# Patient Record
Sex: Female | Born: 1970
Health system: Southern US, Community
[De-identification: ages and names within clinical notes are randomized; demographics above are authoritative.]

## PROBLEM LIST (undated history)

## (undated) DIAGNOSIS — D649 Anemia, unspecified: Secondary | ICD-10-CM

## (undated) DIAGNOSIS — K219 Gastro-esophageal reflux disease without esophagitis: Secondary | ICD-10-CM

## (undated) DIAGNOSIS — N6310 Unspecified lump in the right breast, unspecified quadrant: Secondary | ICD-10-CM

## (undated) DIAGNOSIS — R51 Headache: Secondary | ICD-10-CM

## (undated) DIAGNOSIS — R519 Headache, unspecified: Secondary | ICD-10-CM

## (undated) DIAGNOSIS — E785 Hyperlipidemia, unspecified: Secondary | ICD-10-CM

## (undated) DIAGNOSIS — T7840XA Allergy, unspecified, initial encounter: Secondary | ICD-10-CM

## (undated) DIAGNOSIS — J45909 Unspecified asthma, uncomplicated: Secondary | ICD-10-CM

## (undated) HISTORY — DX: Hyperlipidemia, unspecified: E78.5

## (undated) HISTORY — PX: ROTATOR CUFF REPAIR: SHX139

## (undated) HISTORY — DX: Gastro-esophageal reflux disease without esophagitis: K21.9

## (undated) HISTORY — DX: Allergy, unspecified, initial encounter: T78.40XA

## (undated) HISTORY — PX: BREAST EXCISIONAL BIOPSY: SUR124

## (undated) HISTORY — PX: CHOLECYSTECTOMY: SHX55

## (undated) HISTORY — PX: COLONOSCOPY: SHX174

## (undated) HISTORY — PX: BREAST BIOPSY: SHX20

## (undated) HISTORY — PX: TONSILLECTOMY: SUR1361

## (undated) HISTORY — PX: SINOSCOPY: SHX187

## (undated) HISTORY — DX: Unspecified asthma, uncomplicated: J45.909

## (undated) HISTORY — DX: Anemia, unspecified: D64.9

## (undated) HISTORY — PX: UPPER GASTROINTESTINAL ENDOSCOPY: SHX188

## (undated) HISTORY — PX: ABDOMINAL HYSTERECTOMY: SHX81

---

## 2003-10-05 ENCOUNTER — Ambulatory Visit (HOSPITAL_COMMUNITY): Admission: RE | Admit: 2003-10-05 | Discharge: 2003-10-05 | Payer: Self-pay | Admitting: Gastroenterology

## 2004-12-11 HISTORY — PX: ESOPHAGOGASTRODUODENOSCOPY: SHX1529

## 2005-03-13 ENCOUNTER — Ambulatory Visit (HOSPITAL_BASED_OUTPATIENT_CLINIC_OR_DEPARTMENT_OTHER): Admission: RE | Admit: 2005-03-13 | Discharge: 2005-03-13 | Payer: Self-pay | Admitting: Urology

## 2005-03-13 ENCOUNTER — Ambulatory Visit (HOSPITAL_COMMUNITY): Admission: RE | Admit: 2005-03-13 | Discharge: 2005-03-13 | Payer: Self-pay | Admitting: Urology

## 2016-10-08 ENCOUNTER — Other Ambulatory Visit: Payer: Self-pay | Admitting: Obstetrics and Gynecology

## 2016-10-08 DIAGNOSIS — N6489 Other specified disorders of breast: Secondary | ICD-10-CM

## 2016-10-13 ENCOUNTER — Ambulatory Visit
Admission: RE | Admit: 2016-10-13 | Discharge: 2016-10-13 | Disposition: A | Discharge status: Home / Self Care | Payer: 59 | Source: Ambulatory Visit | Attending: Obstetrics and Gynecology | Admitting: Obstetrics and Gynecology

## 2016-10-13 DIAGNOSIS — N6489 Other specified disorders of breast: Secondary | ICD-10-CM

## 2016-11-05 ENCOUNTER — Other Ambulatory Visit: Payer: Self-pay | Admitting: General Surgery

## 2016-11-05 DIAGNOSIS — N6489 Other specified disorders of breast: Secondary | ICD-10-CM

## 2016-11-06 ENCOUNTER — Other Ambulatory Visit: Payer: Self-pay | Admitting: General Surgery

## 2016-11-06 DIAGNOSIS — N6489 Other specified disorders of breast: Secondary | ICD-10-CM

## 2016-11-25 ENCOUNTER — Encounter (HOSPITAL_BASED_OUTPATIENT_CLINIC_OR_DEPARTMENT_OTHER): Payer: Self-pay | Admitting: *Deleted

## 2016-11-26 ENCOUNTER — Encounter (HOSPITAL_BASED_OUTPATIENT_CLINIC_OR_DEPARTMENT_OTHER): Payer: Self-pay | Admitting: *Deleted

## 2016-11-28 ENCOUNTER — Ambulatory Visit
Admission: RE | Admit: 2016-11-28 | Discharge: 2016-11-28 | Disposition: A | Discharge status: Home / Self Care | Payer: 59 | Source: Ambulatory Visit | Attending: General Surgery | Admitting: General Surgery

## 2016-11-28 DIAGNOSIS — N6489 Other specified disorders of breast: Secondary | ICD-10-CM

## 2016-11-28 NOTE — Progress Notes (Signed)
Boost drink given with instructions to complete by [place], pt verbalized understanding.

## 2016-12-01 ENCOUNTER — Encounter (HOSPITAL_BASED_OUTPATIENT_CLINIC_OR_DEPARTMENT_OTHER)
Admission: RE | Disposition: A | Discharge status: Home / Self Care | Payer: Self-pay | Source: Ambulatory Visit | Attending: General Surgery

## 2016-12-01 ENCOUNTER — Ambulatory Visit
Admission: RE | Admit: 2016-12-01 | Discharge: 2016-12-01 | Disposition: A | Discharge status: Home / Self Care | Payer: 59 | Source: Ambulatory Visit | Attending: General Surgery | Admitting: General Surgery

## 2016-12-01 ENCOUNTER — Ambulatory Visit (HOSPITAL_BASED_OUTPATIENT_CLINIC_OR_DEPARTMENT_OTHER)
Admission: RE | Admit: 2016-12-01 | Discharge: 2016-12-01 | Disposition: A | Discharge status: Home / Self Care | Payer: 59 | Source: Ambulatory Visit | Attending: General Surgery | Admitting: General Surgery

## 2016-12-01 ENCOUNTER — Encounter (HOSPITAL_BASED_OUTPATIENT_CLINIC_OR_DEPARTMENT_OTHER): Payer: Self-pay | Admitting: Certified Registered"

## 2016-12-01 ENCOUNTER — Ambulatory Visit (HOSPITAL_BASED_OUTPATIENT_CLINIC_OR_DEPARTMENT_OTHER): Payer: 59 | Admitting: Certified Registered"

## 2016-12-01 DIAGNOSIS — Z90710 Acquired absence of both cervix and uterus: Secondary | ICD-10-CM | POA: Diagnosis not present

## 2016-12-01 DIAGNOSIS — N6489 Other specified disorders of breast: Secondary | ICD-10-CM | POA: Diagnosis present

## 2016-12-01 DIAGNOSIS — Z886 Allergy status to analgesic agent status: Secondary | ICD-10-CM | POA: Diagnosis not present

## 2016-12-01 DIAGNOSIS — L905 Scar conditions and fibrosis of skin: Secondary | ICD-10-CM | POA: Diagnosis not present

## 2016-12-01 DIAGNOSIS — Z803 Family history of malignant neoplasm of breast: Secondary | ICD-10-CM | POA: Diagnosis not present

## 2016-12-01 HISTORY — DX: Unspecified lump in the right breast, unspecified quadrant: N63.10

## 2016-12-01 HISTORY — DX: Headache: R51

## 2016-12-01 HISTORY — DX: Headache, unspecified: R51.9

## 2016-12-01 HISTORY — PX: RADIOACTIVE SEED GUIDED EXCISIONAL BREAST BIOPSY: SHX6490

## 2016-12-01 SURGERY — RADIOACTIVE SEED GUIDED BREAST BIOPSY
Anesthesia: General | Site: Breast | Laterality: Right

## 2016-12-01 MED ORDER — GABAPENTIN 300 MG PO CAPS
300.0000 mg | ORAL_CAPSULE | ORAL | Status: AC
  Administered 2016-12-01: 300 mg via ORAL

## 2016-12-01 MED ORDER — MIDAZOLAM HCL 2 MG/2ML IJ SOLN
INTRAMUSCULAR | Status: AC
  Filled 2016-12-01: qty 2

## 2016-12-01 MED ORDER — MIDAZOLAM HCL 2 MG/2ML IJ SOLN
1.0000 mg | INTRAMUSCULAR | Status: DC | PRN
  Administered 2016-12-01: 2 mg via INTRAVENOUS

## 2016-12-01 MED ORDER — LIDOCAINE HCL (CARDIAC) 20 MG/ML IV SOLN
INTRAVENOUS | Status: DC | PRN
  Administered 2016-12-01: 60 mg via INTRAVENOUS

## 2016-12-01 MED ORDER — ACETAMINOPHEN 500 MG PO TABS
ORAL_TABLET | ORAL | Status: AC
  Filled 2016-12-01: qty 2

## 2016-12-01 MED ORDER — FENTANYL CITRATE (PF) 100 MCG/2ML IJ SOLN
INTRAMUSCULAR | Status: AC
  Filled 2016-12-01: qty 2

## 2016-12-01 MED ORDER — EPHEDRINE SULFATE 50 MG/ML IJ SOLN
INTRAMUSCULAR | Status: DC | PRN
  Administered 2016-12-01 (×3): 10 mg via INTRAVENOUS

## 2016-12-01 MED ORDER — ACETAMINOPHEN 500 MG PO TABS
1000.0000 mg | ORAL_TABLET | ORAL | Status: AC
  Administered 2016-12-01: 1000 mg via ORAL

## 2016-12-01 MED ORDER — ONDANSETRON HCL 4 MG/2ML IJ SOLN
INTRAMUSCULAR | Status: AC
  Filled 2016-12-01: qty 10

## 2016-12-01 MED ORDER — GABAPENTIN 300 MG PO CAPS
ORAL_CAPSULE | ORAL | Status: AC
  Filled 2016-12-01: qty 1

## 2016-12-01 MED ORDER — CELECOXIB 200 MG PO CAPS: 200.0000 mg | ORAL_CAPSULE | ORAL | Status: DC

## 2016-12-01 MED ORDER — LIDOCAINE 2% (20 MG/ML) 5 ML SYRINGE
INTRAMUSCULAR | Status: AC
  Filled 2016-12-01: qty 10

## 2016-12-01 MED ORDER — DEXAMETHASONE SODIUM PHOSPHATE 10 MG/ML IJ SOLN
INTRAMUSCULAR | Status: AC
  Filled 2016-12-01: qty 2

## 2016-12-01 MED ORDER — SUCCINYLCHOLINE CHLORIDE 200 MG/10ML IV SOSY
PREFILLED_SYRINGE | INTRAVENOUS | Status: AC
  Filled 2016-12-01: qty 10

## 2016-12-01 MED ORDER — METOCLOPRAMIDE HCL 5 MG/ML IJ SOLN: 10.0000 mg | Freq: Once | INTRAMUSCULAR | Status: DC | PRN

## 2016-12-01 MED ORDER — CEFAZOLIN SODIUM-DEXTROSE 2-4 GM/100ML-% IV SOLN
INTRAVENOUS | Status: AC
  Filled 2016-12-01: qty 100

## 2016-12-01 MED ORDER — MEPERIDINE HCL 25 MG/ML IJ SOLN: 6.2500 mg | INTRAMUSCULAR | Status: DC | PRN

## 2016-12-01 MED ORDER — LACTATED RINGERS IV SOLN: INTRAVENOUS | Status: DC

## 2016-12-01 MED ORDER — BUPIVACAINE HCL (PF) 0.25 % IJ SOLN
INTRAMUSCULAR | Status: DC | PRN
  Administered 2016-12-01: 10 mL

## 2016-12-01 MED ORDER — OXYCODONE-ACETAMINOPHEN 10-325 MG PO TABS: 1.0000 | ORAL_TABLET | Freq: Four times a day (QID) | ORAL | 0 refills | Status: AC | PRN

## 2016-12-01 MED ORDER — CEFAZOLIN SODIUM-DEXTROSE 2-4 GM/100ML-% IV SOLN
2.0000 g | INTRAVENOUS | Status: AC
  Administered 2016-12-01: 2 g via INTRAVENOUS

## 2016-12-01 MED ORDER — ONDANSETRON HCL 4 MG/2ML IJ SOLN
INTRAMUSCULAR | Status: DC | PRN
  Administered 2016-12-01: 4 mg via INTRAVENOUS

## 2016-12-01 MED ORDER — SCOPOLAMINE 1 MG/3DAYS TD PT72: 1.0000 | MEDICATED_PATCH | Freq: Once | TRANSDERMAL | Status: DC | PRN

## 2016-12-01 MED ORDER — LACTATED RINGERS IV SOLN
INTRAVENOUS | Status: DC
Start: 2016-12-01 — End: 2016-12-01
  Administered 2016-12-01: 11:00:00 via INTRAVENOUS

## 2016-12-01 MED ORDER — DEXAMETHASONE SODIUM PHOSPHATE 4 MG/ML IJ SOLN
INTRAMUSCULAR | Status: DC | PRN
  Administered 2016-12-01: 10 mg via INTRAVENOUS

## 2016-12-01 MED ORDER — PROPOFOL 10 MG/ML IV BOLUS
INTRAVENOUS | Status: DC | PRN
  Administered 2016-12-01: 120 mg via INTRAVENOUS

## 2016-12-01 MED ORDER — FENTANYL CITRATE (PF) 100 MCG/2ML IJ SOLN
50.0000 ug | INTRAMUSCULAR | Status: DC | PRN
  Administered 2016-12-01: 25 ug via INTRAVENOUS
  Administered 2016-12-01: 50 ug via INTRAVENOUS

## 2016-12-01 MED ORDER — PROPOFOL 500 MG/50ML IV EMUL
INTRAVENOUS | Status: AC
  Filled 2016-12-01: qty 50

## 2016-12-01 MED ORDER — FENTANYL CITRATE (PF) 100 MCG/2ML IJ SOLN: 25.0000 ug | INTRAMUSCULAR | Status: DC | PRN

## 2016-12-01 SURGICAL SUPPLY — 62 items
ADH SKN CLS APL DERMABOND .7 (GAUZE/BANDAGES/DRESSINGS) ×1
APPLIER CLIP 9.375 MED OPEN (MISCELLANEOUS)
APR CLP MED 9.3 20 MLT OPN (MISCELLANEOUS)
BINDER BREAST LRG (GAUZE/BANDAGES/DRESSINGS) ×2 IMPLANT
BINDER BREAST MEDIUM (GAUZE/BANDAGES/DRESSINGS) IMPLANT
BINDER BREAST XLRG (GAUZE/BANDAGES/DRESSINGS) IMPLANT
BINDER BREAST XXLRG (GAUZE/BANDAGES/DRESSINGS) IMPLANT
BLADE SURG 15 STRL LF DISP TIS (BLADE) ×1 IMPLANT
BLADE SURG 15 STRL SS (BLADE) ×3
CANISTER SUC SOCK COL 7IN (MISCELLANEOUS) IMPLANT
CANISTER SUCT 1200ML W/VALVE (MISCELLANEOUS) IMPLANT
CHLORAPREP W/TINT 26ML (MISCELLANEOUS) ×3 IMPLANT
CLIP APPLIE 9.375 MED OPEN (MISCELLANEOUS) IMPLANT
CLIP VESOCCLUDE SM WIDE 6/CT (CLIP) ×3 IMPLANT
CLOSURE WOUND 1/2 X4 (GAUZE/BANDAGES/DRESSINGS) ×1
COVER BACK TABLE 60X90IN (DRAPES) ×3 IMPLANT
COVER MAYO STAND STRL (DRAPES) ×3 IMPLANT
COVER PROBE W GEL 5X96 (DRAPES) ×3 IMPLANT
DECANTER SPIKE VIAL GLASS SM (MISCELLANEOUS) IMPLANT
DERMABOND ADVANCED (GAUZE/BANDAGES/DRESSINGS) ×2
DERMABOND ADVANCED .7 DNX12 (GAUZE/BANDAGES/DRESSINGS) ×1 IMPLANT
DEVICE DUBIN W/COMP PLATE 8390 (MISCELLANEOUS) ×3 IMPLANT
DRAPE LAPAROSCOPIC ABDOMINAL (DRAPES) ×3 IMPLANT
DRAPE UTILITY XL STRL (DRAPES) ×3 IMPLANT
DRSG TEGADERM 4X4.75 (GAUZE/BANDAGES/DRESSINGS) IMPLANT
ELECT COATED BLADE 2.86 ST (ELECTRODE) ×3 IMPLANT
ELECT REM PT RETURN 9FT ADLT (ELECTROSURGICAL) ×3
ELECTRODE REM PT RTRN 9FT ADLT (ELECTROSURGICAL) ×1 IMPLANT
GAUZE SPONGE 4X4 12PLY STRL LF (GAUZE/BANDAGES/DRESSINGS) IMPLANT
GLOVE BIO SURGEON STRL SZ7 (GLOVE) ×6 IMPLANT
GLOVE BIOGEL PI IND STRL 7.0 (GLOVE) IMPLANT
GLOVE BIOGEL PI IND STRL 7.5 (GLOVE) ×2 IMPLANT
GLOVE BIOGEL PI INDICATOR 7.0 (GLOVE) ×2
GLOVE BIOGEL PI INDICATOR 7.5 (GLOVE) ×4
GLOVE SURG SS PI 6.5 STRL IVOR (GLOVE) ×3 IMPLANT
GOWN STRL REUS W/ TWL LRG LVL3 (GOWN DISPOSABLE) ×2 IMPLANT
GOWN STRL REUS W/TWL LRG LVL3 (GOWN DISPOSABLE) ×6
HEMOSTAT ARISTA ABSORB 3G PWDR (MISCELLANEOUS) IMPLANT
ILLUMINATOR WAVEGUIDE N/F (MISCELLANEOUS) ×2 IMPLANT
KIT MARKER MARGIN INK (KITS) ×3 IMPLANT
LIGHT WAVEGUIDE WIDE FLAT (MISCELLANEOUS) IMPLANT
NDL HYPO 25X1 1.5 SAFETY (NEEDLE) ×1 IMPLANT
NEEDLE HYPO 25X1 1.5 SAFETY (NEEDLE) ×3 IMPLANT
NS IRRIG 1000ML POUR BTL (IV SOLUTION) IMPLANT
PACK BASIN DAY SURGERY FS (CUSTOM PROCEDURE TRAY) ×3 IMPLANT
PENCIL BUTTON HOLSTER BLD 10FT (ELECTRODE) ×3 IMPLANT
SLEEVE SCD COMPRESS KNEE MED (MISCELLANEOUS) ×3 IMPLANT
SPONGE LAP 4X18 X RAY DECT (DISPOSABLE) ×3 IMPLANT
STRIP CLOSURE SKIN 1/2X4 (GAUZE/BANDAGES/DRESSINGS) ×2 IMPLANT
SUT MNCRL AB 4-0 PS2 18 (SUTURE) IMPLANT
SUT MON AB 5-0 PS2 18 (SUTURE) ×2 IMPLANT
SUT SILK 2 0 SH (SUTURE) IMPLANT
SUT VIC AB 2-0 SH 27 (SUTURE) ×3
SUT VIC AB 2-0 SH 27XBRD (SUTURE) ×1 IMPLANT
SUT VIC AB 3-0 SH 27 (SUTURE) ×3
SUT VIC AB 3-0 SH 27X BRD (SUTURE) ×1 IMPLANT
SYR CONTROL 10ML LL (SYRINGE) ×3 IMPLANT
TOWEL OR 17X24 6PK STRL BLUE (TOWEL DISPOSABLE) ×3 IMPLANT
TOWEL OR NON WOVEN STRL DISP B (DISPOSABLE) ×3 IMPLANT
TUBE CONNECTING 20'X1/4 (TUBING)
TUBE CONNECTING 20X1/4 (TUBING) IMPLANT
YANKAUER SUCT BULB TIP NO VENT (SUCTIONS) IMPLANT

## 2016-12-01 NOTE — Anesthesia Postprocedure Evaluation (Addendum)
Anesthesia Post Note  Patient: Brooke Haas  Procedure(s) Performed: Procedure(s) (LRB): RIGHT RADIOACTIVE SEED GUIDED EXCISIONAL BREAST BIOPSY (Right)     Patient location during evaluation: PACU Anesthesia Type: General Level of consciousness: awake and alert Pain management: pain level controlled Vital Signs Assessment: post-procedure vital signs reviewed and stable Respiratory status: spontaneous breathing, nonlabored ventilation, respiratory function stable and patient connected to nasal cannula oxygen Cardiovascular status: blood pressure returned to baseline and stable Postop Assessment: no signs of nausea or vomiting Anesthetic complications: no    Last Vitals:  Vitals:   12/01/16 1215 12/01/16 1245  BP: 97/64 (!) 96/52  Pulse: 86 86  Resp: 11 18  Temp:  (!) 36.3 C    Last Pain:  Vitals:   12/01/16 0957  TempSrc: Oral                 Montez Hageman

## 2016-12-01 NOTE — Progress Notes (Signed)
Patient ID: Brooke Haas, female   DOB: 1970-09-18, 46 y.o.   MRN: [id number] NCCSR reviewed day of surgery

## 2016-12-01 NOTE — Discharge Instructions (Signed)
[facility] °Office Phone Number [phone] ° °POST OP INSTRUCTIONS ° °Always review your discharge instruction sheet given to you by the facility where your surgery was performed. ° °IF YOU HAVE DISABILITY OR FAMILY LEAVE FORMS, YOU MUST BRING THEM TO THE OFFICE FOR PROCESSING.  DO NOT GIVE THEM TO YOUR DOCTOR. ° °1. A prescription for pain medication may be given to you upon discharge.  Take your pain medication as prescribed, if needed.  If narcotic pain medicine is not needed, then you may take acetaminophen (Tylenol), naprosyn (Alleve) or ibuprofen (Advil) as needed. °2. Take your usually prescribed medications unless otherwise directed °3. If you need a refill on your pain medication, please contact your pharmacy.  They will contact our office to request authorization.  Prescriptions will not be filled after 5pm or on week-ends. °4. You should eat very light the first 24 hours after surgery, such as soup, crackers, pudding, etc.  Resume your normal diet the day after surgery. °5. Most patients will experience some swelling and bruising in the breast.  Ice packs and a good support bra will help.  Wear the breast binder provided or a sports bra for 72 hours day and night.  After that wear a sports bra during the day until you return to the office. Swelling and bruising can take several days to resolve.  °6. It is common to experience some constipation if taking pain medication after surgery.  Increasing fluid intake and taking a stool softener will usually help or prevent this problem from occurring.  A mild laxative (Milk of Magnesia or Miralax) should be taken according to package directions if there are no bowel movements after 48 hours. °7. Unless discharge instructions indicate otherwise, you may remove your bandages 48 hours after surgery and you may shower at that time.  You may have steri-strips (small skin tapes) in place directly over the incision.  These strips should be left on the  skin for 7-10 days and will come off on their own.  If your surgeon used skin glue on the incision, you may shower in 24 hours.  The glue will flake off over the next 2-3 weeks.  Any sutures or staples will be removed at the office during your follow-up visit. °8. ACTIVITIES:  You may resume regular daily activities (gradually increasing) beginning the next day.  Wearing a good support bra or sports bra minimizes pain and swelling.  You may have sexual intercourse when it is comfortable. °a. You may drive when you no longer are taking prescription pain medication, you can comfortably wear a seatbelt, and you can safely maneuver your car and apply brakes. °b. RETURN TO WORK:  ______________________________________________________________________________________ °9. You should see your doctor in the office for a follow-up appointment approximately two weeks after your surgery.  Your doctor’s nurse will typically make your follow-up appointment when she calls you with your pathology report.  Expect your pathology report 3-4 business days after your surgery.  You may call to check if you do not hear from us after three days. °10. OTHER INSTRUCTIONS: _______________________________________________________________________________________________ _____________________________________________________________________________________________________________________________________ °_____________________________________________________________________________________________________________________________________ °_____________________________________________________________________________________________________________________________________ ° °WHEN TO CALL DR WAKEFIELD: °1. Fever over 101.0 °2. Nausea and/or vomiting. °3. Extreme swelling or bruising. °4. Continued bleeding from incision. °5. Increased pain, redness, or drainage from the incision. ° °The clinic staff is available to answer your questions during regular  business hours.  Please don’t hesitate to call and ask to speak to one of the nurses for clinical concerns.  If   you have a medical emergency, go to the nearest emergency room or call 911.  A surgeon from [facility] is always on call at the hospital. ° °For further questions, please visit [email or web address] ° ° ° ° ° °Post Anesthesia Home Care Instructions ° °Activity: °Get plenty of rest for the remainder of the day. A responsible individual must stay with you for 24 hours following the procedure.  °For the next 24 hours, DO NOT: °-Drive a car °-Operate machinery °-Drink alcoholic beverages °-Take any medication unless instructed by your physician °-Make any legal decisions or sign important papers. ° °Meals: °Start with liquid foods such as gelatin or soup. Progress to regular foods as tolerated. Avoid greasy, spicy, heavy foods. If nausea and/or vomiting occur, drink only clear liquids until the nausea and/or vomiting subsides. Call your physician if vomiting continues. ° °Special Instructions/Symptoms: °Your throat may feel dry or sore from the anesthesia or the breathing tube placed in your throat during surgery. If this causes discomfort, gargle with warm salt water. The discomfort should disappear within 24 hours. ° °If you had a scopolamine patch placed behind your ear for the management of post- operative nausea and/or vomiting: ° °1. The medication in the patch is effective for 72 hours, after which it should be removed.  Wrap patch in a tissue and discard in the trash. Wash hands thoroughly with soap and water. °2. You may remove the patch earlier than 72 hours if you experience unpleasant side effects which may include dry mouth, dizziness or visual disturbances. °3. Avoid touching the patch. Wash your hands with soap and water after contact with the patch. °  ° °

## 2016-12-01 NOTE — Interval H&P Note (Signed)
History and Physical Interval Note:  12/01/2016 10:44 AM  Brooke Haas  has presented today for surgery, with the diagnosis of right breast mass  The various methods of treatment have been discussed with the patient and family. After consideration of risks, benefits and other options for treatment, the patient has consented to  Procedure(s): RIGHT RADIOACTIVE SEED GUIDED EXCISIONAL BREAST BIOPSY (Right) as a surgical intervention .  The patient's history has been reviewed, patient examined, no change in status, stable for surgery.  I have reviewed the patient's chart and labs.  Questions were answered to the patient's satisfaction.     Ajene Carchi

## 2016-12-01 NOTE — Anesthesia Procedure Notes (Signed)
Procedure Name: LMA Insertion Date/Time: 12/01/2016 10:54 AM Performed by: Guillermo Nehring D Pre-anesthesia Checklist: Patient identified, Emergency Drugs available, Suction available and Patient being monitored Patient Re-evaluated:Patient Re-evaluated prior to induction Oxygen Delivery Method: Circle system utilized Preoxygenation: Pre-oxygenation with 100% oxygen Induction Type: IV induction Ventilation: Mask ventilation without difficulty LMA: LMA inserted LMA Size: 3.0 Number of attempts: 1 Airway Equipment and Method: Bite block Placement Confirmation: positive ETCO2 Tube secured with: Tape Dental Injury: Teeth and Oropharynx as per pre-operative assessment

## 2016-12-01 NOTE — Anesthesia Preprocedure Evaluation (Signed)
Anesthesia Evaluation  Patient identified by MRN, date of birth, ID band Patient awake    Reviewed: Allergy & Precautions, NPO status , Patient's Chart, lab work & pertinent test results  Airway Mallampati: II  TM Distance: >3 FB Neck ROM: Full    Dental no notable dental hx.    Pulmonary neg pulmonary ROS,    Pulmonary exam normal breath sounds clear to auscultation       Cardiovascular negative cardio ROS Normal cardiovascular exam Rhythm:Regular Rate:Normal     Neuro/Psych negative neurological ROS  negative psych ROS   GI/Hepatic negative GI ROS, Neg liver ROS,   Endo/Other  negative endocrine ROS  Renal/GU negative Renal ROS  negative genitourinary   Musculoskeletal negative musculoskeletal ROS (+)   Abdominal   Peds negative pediatric ROS (+)  Hematology negative hematology ROS (+)   Anesthesia Other Findings   Reproductive/Obstetrics negative OB ROS                             Anesthesia Physical Anesthesia Plan  ASA: II  Anesthesia Plan: General   Post-op Pain Management:    Induction: Intravenous  PONV Risk Score and Plan: 3 and Ondansetron, Dexamethasone, Midazolam and Treatment may vary due to age or medical condition  Airway Management Planned: LMA  Additional Equipment:   Intra-op Plan:   Post-operative Plan:   Informed Consent: I have reviewed the patients History and Physical, chart, labs and discussed the procedure including the risks, benefits and alternatives for the proposed anesthesia with the patient or authorized representative who has indicated his/her understanding and acceptance.   Dental advisory given  Plan Discussed with: CRNA  Anesthesia Plan Comments:         Anesthesia Quick Evaluation

## 2016-12-01 NOTE — Op Note (Signed)
Preoperative diagnoses: Right breast distortion with core biopsy c/w radial scar Postoperative diagnosis: Same as above Procedure: Right breast seed excisional biopsy Surgeon: Dr. Serita Grammes Anesthesia: Gen. Estimated blood loss: minimal Complications: None Drains: None Specimens: Right breast tissue marked with paint Sponge and needle count correct at completion Disposition to recovery stable  Indications: This is a 63 yof who has family history breast cancer who presents after having a right breast distortion on mammogram.  The core biopsy was a radial scar.  We discussed options and have elected to excise this with seed guidance.   Procedure: After informed consent was obtained she was then taken to the operating room. She was given cefazolin. Sequential compression devices were on her legs. She was placed under general anesthesia without complication. Her chestwas then prepped and draped in the standard sterile surgical fashion. A surgical timeout was then performed. The seed was in the lateral right breast.  I infiltrated marcaine and made a  periareolar incision to hide the scar. I used the lighted retractor to dissect laterally to the seed. I then used the neoprobe to guide excision of the seed and surrounding tissue.  Mammogram confirmed removal of seed and the clip. This was then all sent to pathology. Hemostasis was observed. Clip was placed in the cavity. I closed the breast tissue with a 2-0 Vicryl. The dermis was closed with 3-0 Vicryl and the skin with 5-0 Monocryl.Dermabond and steristrips were placed on the incision. A breast binder was placed. She was transferred to recovery stable

## 2016-12-01 NOTE — Transfer of Care (Signed)
Immediate Anesthesia Transfer of Care Note  Patient: Brooke Haas  Procedure(s) Performed: Procedure(s): RIGHT RADIOACTIVE SEED GUIDED EXCISIONAL BREAST BIOPSY (Right)  Patient Location: PACU  Anesthesia Type:General  Level of Consciousness: awake, sedated and patient cooperative  Airway & Oxygen Therapy: Patient Spontanous Breathing and Patient connected to face mask oxygen  Post-op Assessment: Report given to RN and Post -op Vital signs reviewed and stable  Post vital signs: Reviewed and stable  Last Vitals:  Vitals:   12/01/16 0957  BP: (!) 98/59  Pulse: 68  Resp: 18  Temp: 36.7 C    Last Pain:  Vitals:   12/01/16 0957  TempSrc: Oral         Complications: No apparent anesthesia complications

## 2016-12-01 NOTE — H&P (Signed)
46 yof referred by Dr Jimmye Norman for a right breast distortion. she has fh in mom in her mid 49s of breast cancer who now has stage IV disease. she had no mass or dc. screening mm showed right breast distortion and core biopsy shows a csl. she is otherwise healthy and has no prior breast history  Past Surgical History Malachy Moan, RMA; 11/05/2016 1:40 PM) Cesarean Section - Multiple  Foot Surgery  Bilateral. Gallbladder Surgery - Laparoscopic  Hysterectomy (not due to cancer) - Complete  Shoulder Surgery  Right. Tonsillectomy   Diagnostic Studies History Malachy Moan, [place]; 11/05/2016 1:40 PM) Colonoscopy  never Mammogram  within last year Pap Smear  1-5 years ago  Allergies Malachy Moan, RMA; 11/05/2016 1:40 PM) Aleve *ANALGESICS - ANTI-INFLAMMATORY*  Ibuprofen *ANALGESICS - ANTI-INFLAMMATORY*  Naproxen *ANALGESICS - ANTI-INFLAMMATORY*   Medication History Malachy Moan, RMA; 11/05/2016 1:41 PM) Naratriptan HCl (2.5MG  Tablet, Oral) Active. Uribel (118MG  Capsule, Oral) Active. Multivitamins/Minerals (Oral) Active. Allegra (Oral) Specific strength unknown - Active. Vitamin D (1000UNIT Tablet, Oral) Active. Zantac (150MG  Tablet, Oral) Active. Medications Reconciled  Social History Malachy Moan, [place]; 11/05/2016 1:40 PM) Alcohol use  Occasional alcohol use. Caffeine use  Coffee. No drug use  Tobacco use  Never smoker.  Family History Malachy Moan, [place]; 11/05/2016 1:40 PM) Breast Cancer  Mother. Cerebrovascular Accident  Mother. Diabetes Mellitus  Father. Hypertension  Father. Migraine Headache  Daughter, Mother, Tora Duck, Pandora Leiter.  Pregnancy / Birth History Malachy Moan, [place]; 11/05/2016 1:40 PM) Age at menarche  17 years. Gravida  3 Length (months) of breastfeeding  12-24 Maternal age  60-30 Para  3  Other Problems Malachy Moan, [place]; 11/05/2016 1:40 PM) Gastroesophageal Reflux Disease  Migraine Headache     Review of Systems Malachy Moan RMA; 11/05/2016 1:40 PM) General Not Present- Appetite Loss, Chills, Fatigue, Fever, Night Sweats, Weight Gain and Weight Loss. Skin Not Present- Change in Wart/Mole, Dryness, Hives, Jaundice, New Lesions, Non-Healing Wounds, Rash and Ulcer. HEENT Not Present- Earache, Hearing Loss, Hoarseness, Nose Bleed, Oral Ulcers, Ringing in the Ears, Seasonal Allergies, Sinus Pain, Sore Throat, Visual Disturbances, Wears glasses/contact lenses and Yellow Eyes. Respiratory Not Present- Bloody sputum, Chronic Cough, Difficulty Breathing, Snoring and Wheezing. Gastrointestinal Not Present- Abdominal Pain, Bloating, Bloody Stool, Change in Bowel Habits, Chronic diarrhea, Constipation, Difficulty Swallowing, Excessive gas, Gets full quickly at meals, Hemorrhoids, Indigestion, Nausea, Rectal Pain and Vomiting. Female Genitourinary Not Present- Frequency, Nocturia, Painful Urination, Pelvic Pain and Urgency. Musculoskeletal Not Present- Back Pain, Joint Pain, Joint Stiffness, Muscle Pain, Muscle Weakness and Swelling of Extremities. Neurological Not Present- Decreased Memory, Fainting, Headaches, Numbness, Seizures, Tingling, Tremor, Trouble walking and Weakness. Psychiatric Not Present- Anxiety, Bipolar, Change in Sleep Pattern, Depression, Fearful and Frequent crying. Endocrine Not Present- Cold Intolerance, Excessive Hunger, Hair Changes, Heat Intolerance, Hot flashes and New Diabetes. Hematology Not Present- Blood Thinners, Easy Bruising, Excessive bleeding, Gland problems, HIV and Persistent Infections.  Vitals Malachy Moan RMA; 11/05/2016 1:42 PM) 11/05/2016 1:41 PM Weight: 145.4 lb Height: 65in Body Surface Area: 1.73 m Body Mass Index: 24.2 kg/m  Temp.: 97.43F  Pulse: 87 (Regular)  BP: 110/80 (Sitting, Left Arm, Standard) Physical Exam Rolm Bookbinder MD; 11/05/2016 2:31 PM) General Mental Status-Alert. Orientation-Oriented X3. Head and  Neck Thyroid -Note: no thyromegaly. Chest and Lung Exam Chest and lung exam reveals -quiet, even and easy respiratory effort with no use of accessory muscles and on auscultation, normal breath sounds, no adventitious sounds and normal vocal resonance. Breast Nipples-No Discharge. Breast Lump-No Palpable Breast Mass.  Cardiovascular Cardiovascular examination reveals -normal heart sounds, regular rate and rhythm with no murmurs. Lymphatic Head & Neck General Head & Neck Lymphatics: Bilateral - Description - Normal. Axillary General Axillary Region: Bilateral - Description - Normal. Note: no Celina adenopathy   Assessment & Plan Rolm Bookbinder MD; 11/05/2016 2:33 PM) RADIAL SCAR OF BREAST (N64.89) Story: right breast seed guided excisional biopsy we discussed option of observation but I do think with her fh reasonable to excise. we discussed up to10% upgrade rate. this could be early cancer (dcis) or atypia. atypia certainly would possibly prompt change in mgt so excision would be worthwhile. we discussed seed guided excision, recovery and risks.

## 2016-12-02 ENCOUNTER — Encounter (HOSPITAL_BASED_OUTPATIENT_CLINIC_OR_DEPARTMENT_OTHER): Payer: Self-pay | Admitting: General Surgery

## 2016-12-03 NOTE — Addendum Note (Signed)
Addendum  created 12/03/16 0804 by Montez Hageman, MD   Sign clinical note

## 2017-03-30 ENCOUNTER — Other Ambulatory Visit: Payer: Self-pay | Admitting: Obstetrics and Gynecology

## 2017-03-30 DIAGNOSIS — R9389 Abnormal findings on diagnostic imaging of other specified body structures: Secondary | ICD-10-CM

## 2017-04-08 ENCOUNTER — Ambulatory Visit
Admission: RE | Admit: 2017-04-08 | Discharge: 2017-04-08 | Disposition: A | Discharge status: Home / Self Care | Payer: 59 | Source: Ambulatory Visit | Attending: Obstetrics and Gynecology | Admitting: Obstetrics and Gynecology

## 2017-04-08 DIAGNOSIS — R9389 Abnormal findings on diagnostic imaging of other specified body structures: Secondary | ICD-10-CM

## 2017-04-08 MED ORDER — GADOBENATE DIMEGLUMINE 529 MG/ML IV SOLN
13.0000 mL | Freq: Once | INTRAVENOUS | Status: AC | PRN
  Administered 2017-04-08: 13 mL via INTRAVENOUS

## 2017-10-16 ENCOUNTER — Other Ambulatory Visit: Payer: Self-pay | Admitting: Obstetrics and Gynecology

## 2017-10-16 DIAGNOSIS — Z1231 Encounter for screening mammogram for malignant neoplasm of breast: Secondary | ICD-10-CM

## 2017-10-16 DIAGNOSIS — N6489 Other specified disorders of breast: Secondary | ICD-10-CM

## 2017-10-27 ENCOUNTER — Ambulatory Visit
Admission: RE | Admit: 2017-10-27 | Discharge: 2017-10-27 | Disposition: A | Discharge status: Home / Self Care | Payer: 59 | Source: Ambulatory Visit | Attending: Obstetrics and Gynecology | Admitting: Obstetrics and Gynecology

## 2017-10-27 DIAGNOSIS — Z1231 Encounter for screening mammogram for malignant neoplasm of breast: Secondary | ICD-10-CM

## 2017-10-30 ENCOUNTER — Other Ambulatory Visit: Payer: 59

## 2017-11-04 ENCOUNTER — Ambulatory Visit
Admission: RE | Admit: 2017-11-04 | Discharge: 2017-11-04 | Disposition: A | Discharge status: Home / Self Care | Payer: 59 | Source: Ambulatory Visit | Attending: Obstetrics and Gynecology | Admitting: Obstetrics and Gynecology

## 2017-11-04 DIAGNOSIS — N6489 Other specified disorders of breast: Secondary | ICD-10-CM

## 2017-11-04 MED ORDER — GADOBENATE DIMEGLUMINE 529 MG/ML IV SOLN
13.0000 mL | Freq: Once | INTRAVENOUS | Status: AC | PRN
  Administered 2017-11-04: 13 mL via INTRAVENOUS

## 2018-06-07 ENCOUNTER — Other Ambulatory Visit: Payer: Self-pay | Admitting: Obstetrics and Gynecology

## 2018-06-07 DIAGNOSIS — N6489 Other specified disorders of breast: Secondary | ICD-10-CM

## 2018-06-18 ENCOUNTER — Ambulatory Visit
Admission: RE | Admit: 2018-06-18 | Discharge: 2018-06-18 | Disposition: A | Discharge status: Home / Self Care | Payer: 59 | Source: Ambulatory Visit | Attending: Obstetrics and Gynecology | Admitting: Obstetrics and Gynecology

## 2018-06-18 DIAGNOSIS — N6489 Other specified disorders of breast: Secondary | ICD-10-CM

## 2018-06-18 MED ORDER — GADOBUTROL 1 MMOL/ML IV SOLN
7.0000 mL | Freq: Once | INTRAVENOUS | Status: AC | PRN
  Administered 2018-06-18: 7 mL via INTRAVENOUS

## 2018-09-13 ENCOUNTER — Other Ambulatory Visit: Payer: Self-pay | Admitting: Obstetrics and Gynecology

## 2018-09-13 DIAGNOSIS — Z1231 Encounter for screening mammogram for malignant neoplasm of breast: Secondary | ICD-10-CM

## 2018-11-05 ENCOUNTER — Other Ambulatory Visit: Payer: Self-pay

## 2018-11-05 ENCOUNTER — Ambulatory Visit
Admission: RE | Admit: 2018-11-05 | Discharge: 2018-11-05 | Disposition: A | Discharge status: Home / Self Care | Payer: 59 | Source: Ambulatory Visit | Attending: Obstetrics and Gynecology | Admitting: Obstetrics and Gynecology

## 2018-11-05 DIAGNOSIS — Z1231 Encounter for screening mammogram for malignant neoplasm of breast: Secondary | ICD-10-CM

## 2018-11-08 ENCOUNTER — Other Ambulatory Visit: Payer: Self-pay | Admitting: Obstetrics and Gynecology

## 2018-11-08 DIAGNOSIS — R928 Other abnormal and inconclusive findings on diagnostic imaging of breast: Secondary | ICD-10-CM

## 2018-11-10 ENCOUNTER — Ambulatory Visit
Admission: RE | Admit: 2018-11-10 | Discharge: 2018-11-10 | Disposition: A | Discharge status: Home / Self Care | Payer: 59 | Source: Ambulatory Visit | Attending: Obstetrics and Gynecology | Admitting: Obstetrics and Gynecology

## 2018-11-10 ENCOUNTER — Other Ambulatory Visit: Payer: Self-pay

## 2018-11-10 DIAGNOSIS — R928 Other abnormal and inconclusive findings on diagnostic imaging of breast: Secondary | ICD-10-CM

## 2019-06-07 IMAGING — MG MM CLIP PLACEMENT
6 series · 6 of 14 positions shown · non-contrast
Comparison: Previous exam(s).

CLINICAL DATA: Evaluate biopsy marker placement

EXAM:
DIAGNOSTIC RIGHT MAMMOGRAM POST STEREOTACTIC BIOPSY

[R ML synth-2D]
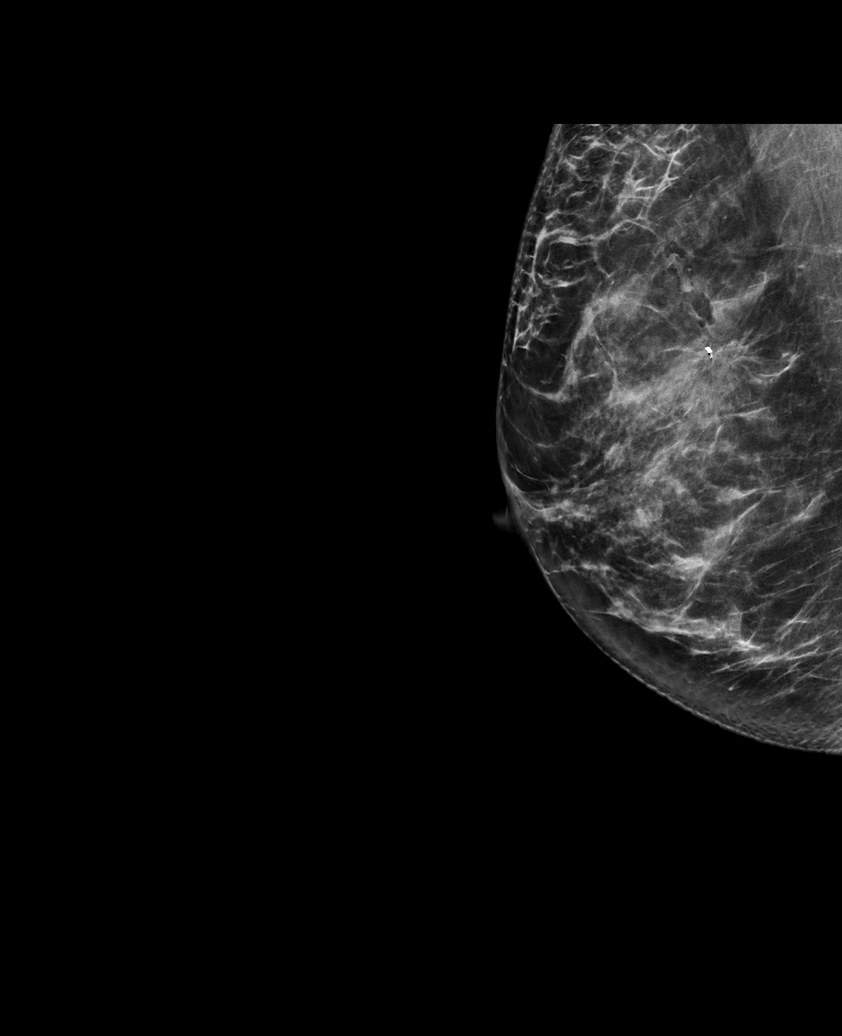

[R ML]
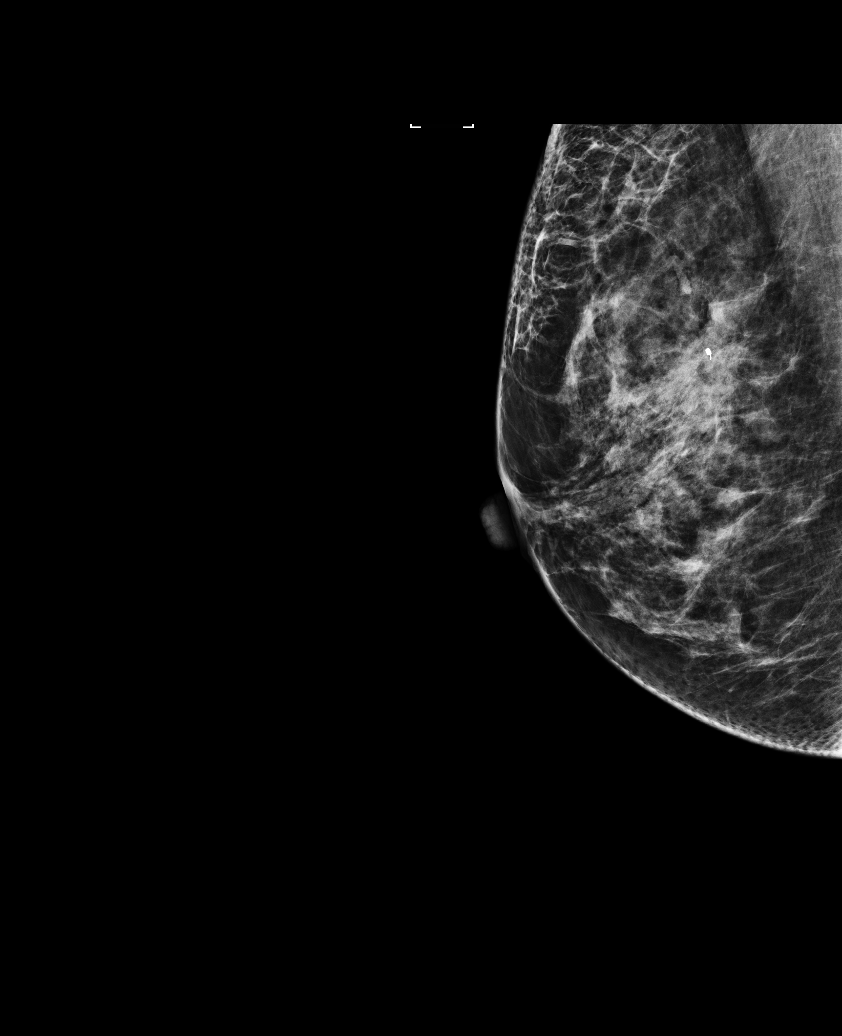

[R XCCL]
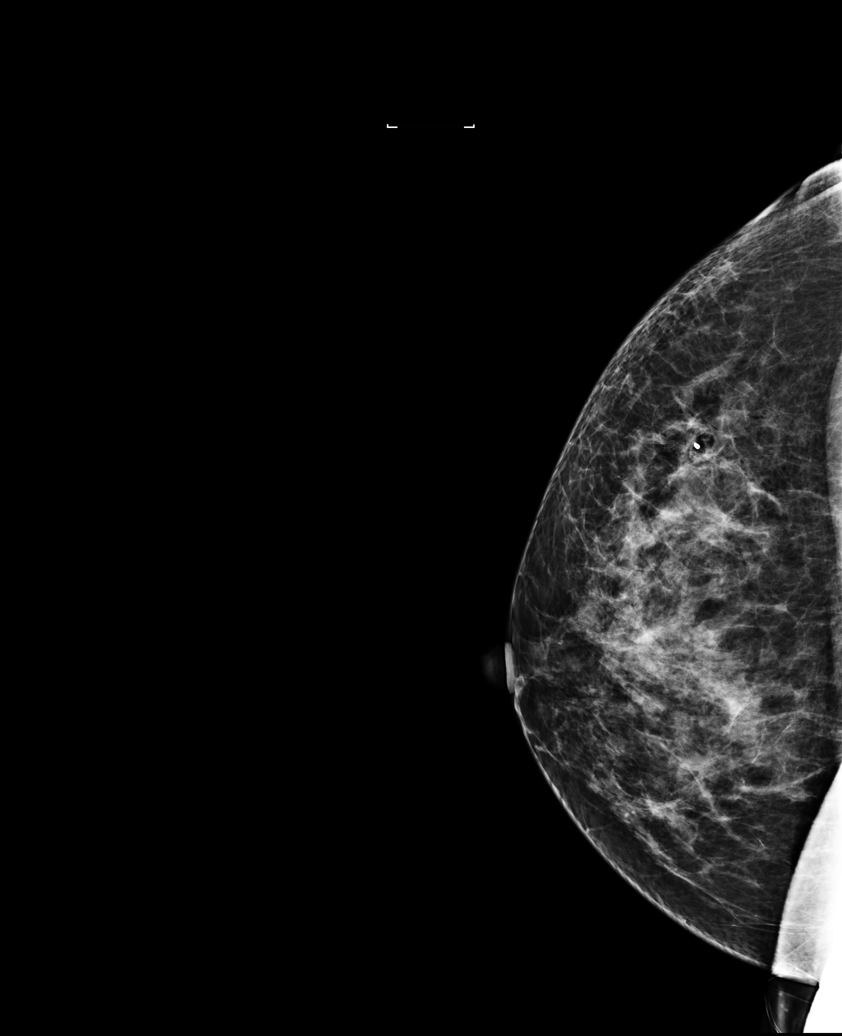

[R XCCL synth-2D]
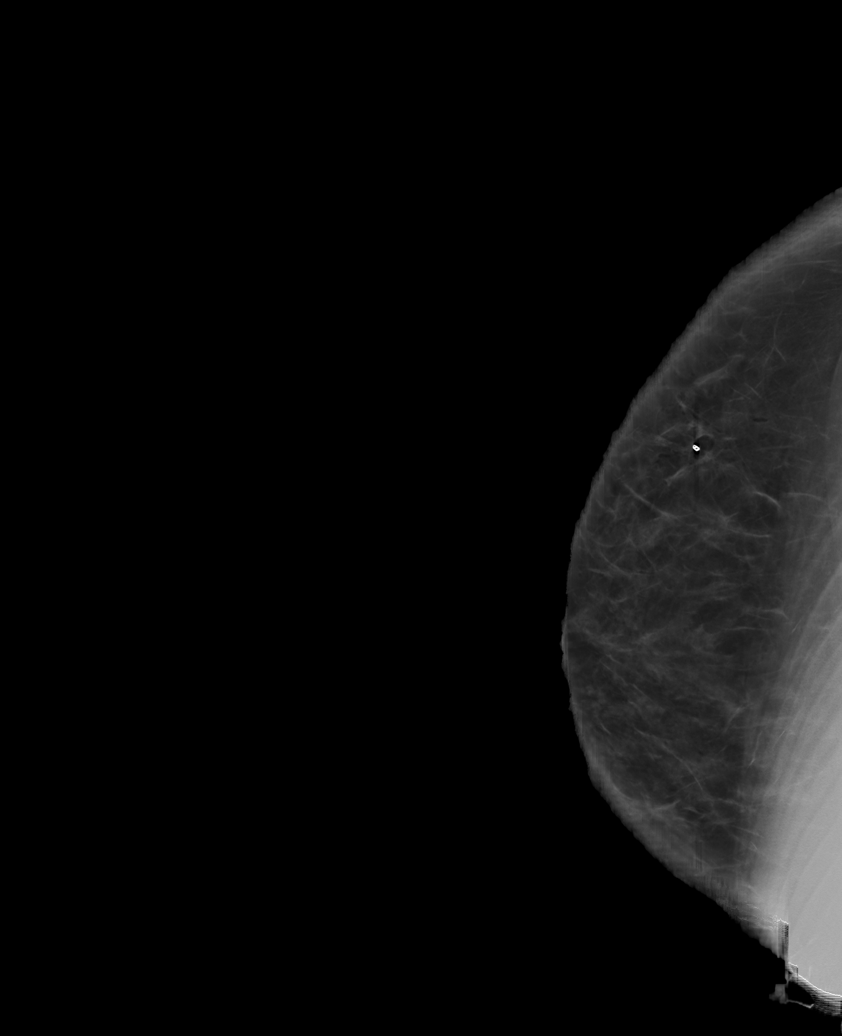

[R XCCL tomo · tomo slice 43/84.0]
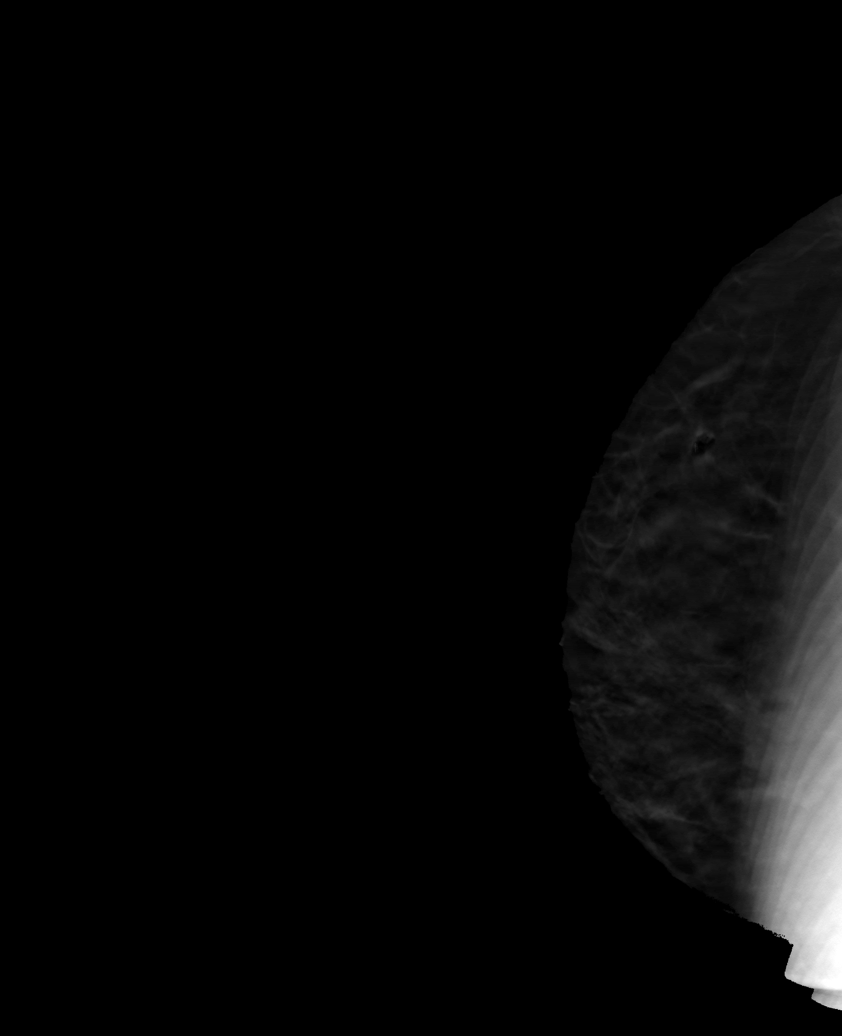

[R ML tomo · tomo slice 39/76.0]
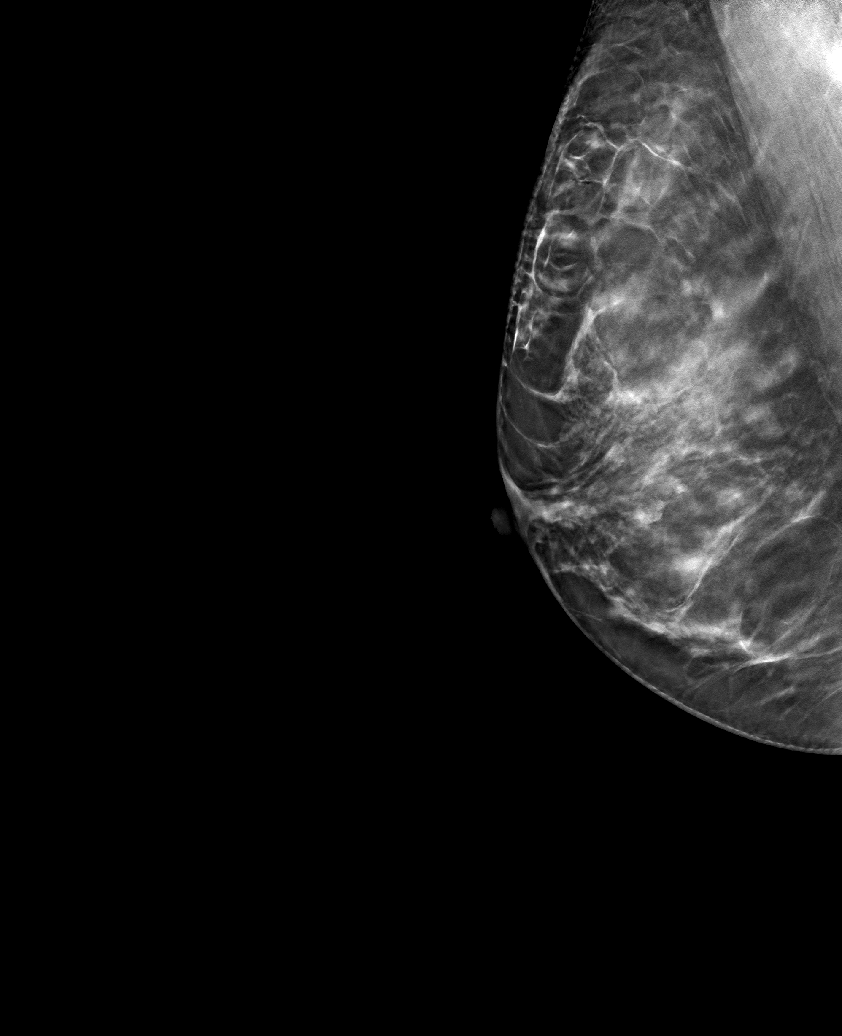

[6 of 14 positions shown; findings below may reference images not displayed]

FINDINGS: Mammographic images were obtained following stereotactic guided
biopsy of right breast distortion. The coil shaped clip is within
the biopsied distortion.
IMPRESSION: Appropriate clip placement as above.

Final Assessment: Post Procedure Mammograms for Marker Placement

## 2019-06-07 IMAGING — MG STEREOTACTIC VACUUM ASSIST RIGHT
3 series · 3 of 11 positions shown · non-contrast
Comparison: Previous exams.

ADDENDUM:
Pathology revealed COMPLEX SCLEROSING LESION WITH CALCIFICATIONS,
FIBROCYSTIC CHANGES WITH FOCAL USUAL DUCTAL HYPERPLASIA AND
CALCIFICATIONS of the Right breast, upper outer quadrant. This was
found to be concordant by Dr. Gobatlwang Nurse, with excision
recommended. Pathology results were discussed with the patient by
telephone. The patient reported doing well after the biopsy with
tenderness at the site. Post biopsy instructions and care were
reviewed and questions were answered. The patient was encouraged to
call The [REDACTED] for any additional
concerns. Surgical consultation has been arranged with Dr. Ebadat
Raventus at [REDACTED], per patient request, on November 05, 2016. Consideration for bilateral breast MRI due to the
patient's 23 % lifetime risk of breast cancer.

Pathology results reported by Daysem Cem, RN on 10/14/2016.
CLINICAL DATA: Right breast distortion.  Stereotactic biopsy.
EXAM:
RIGHT BREAST STEREOTACTIC CORE NEEDLE BIOPSY

[R CC]
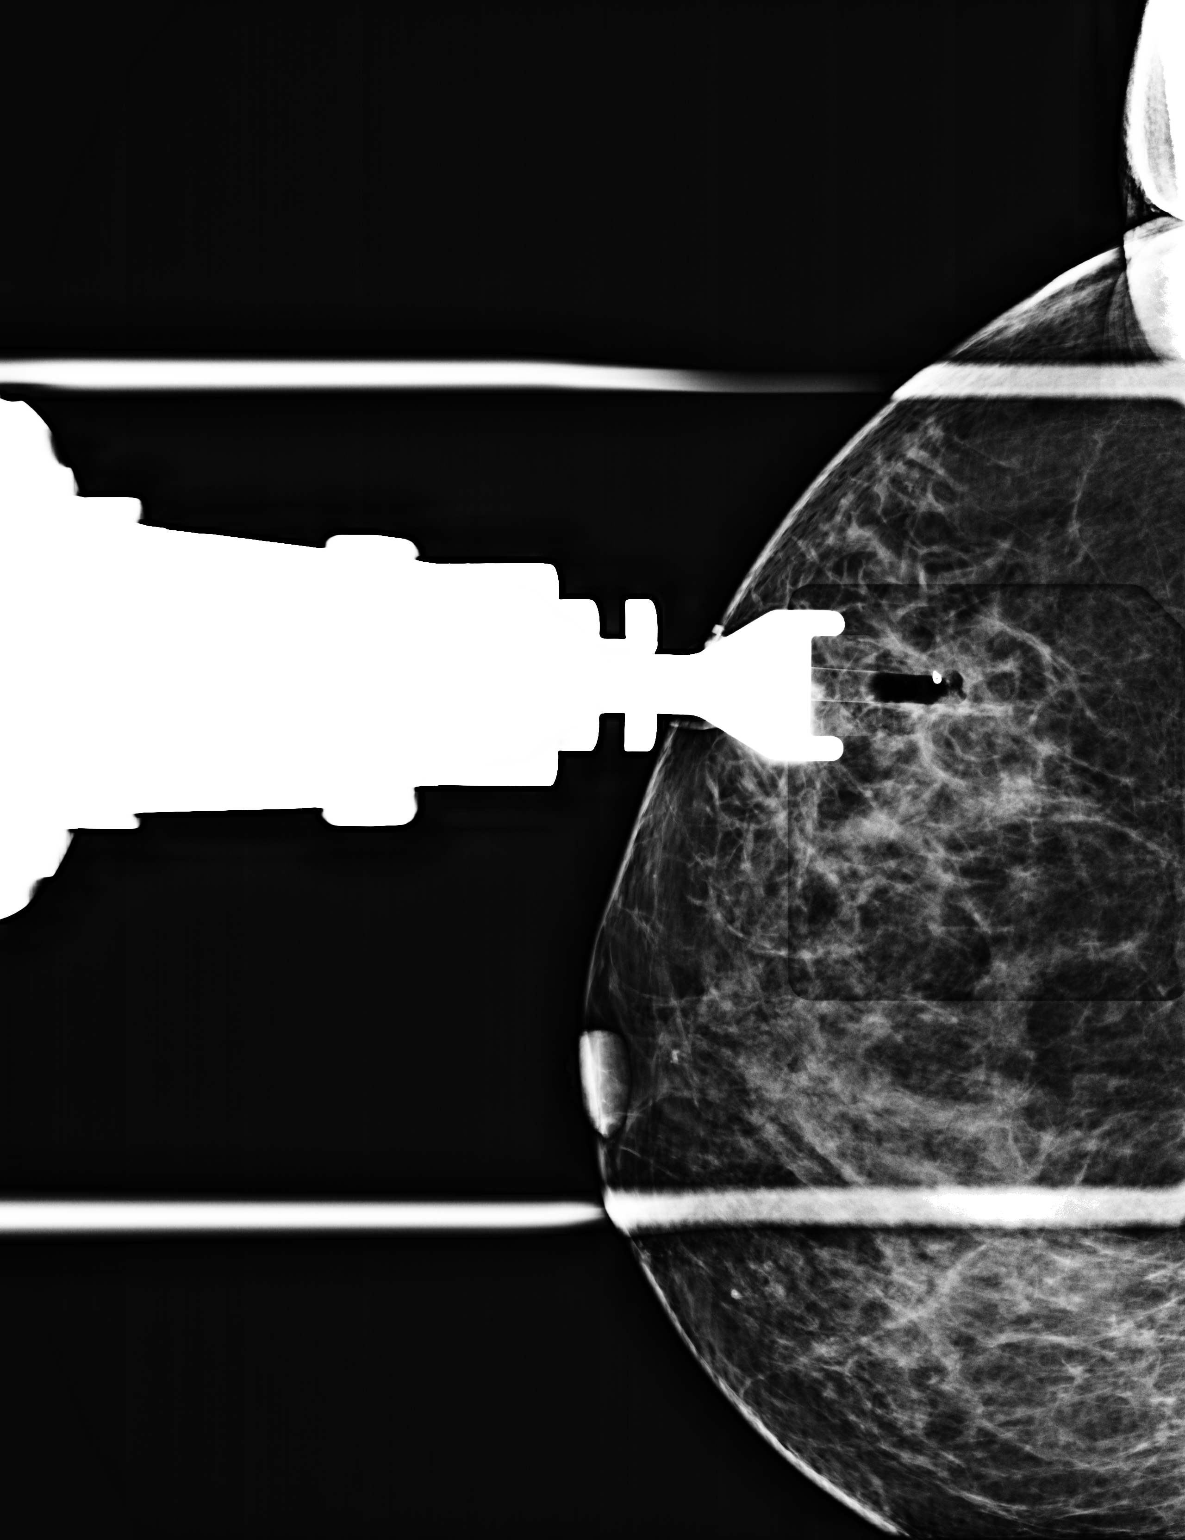

[R CC tomo (1 of 2) · tomo slice 39/77.0]
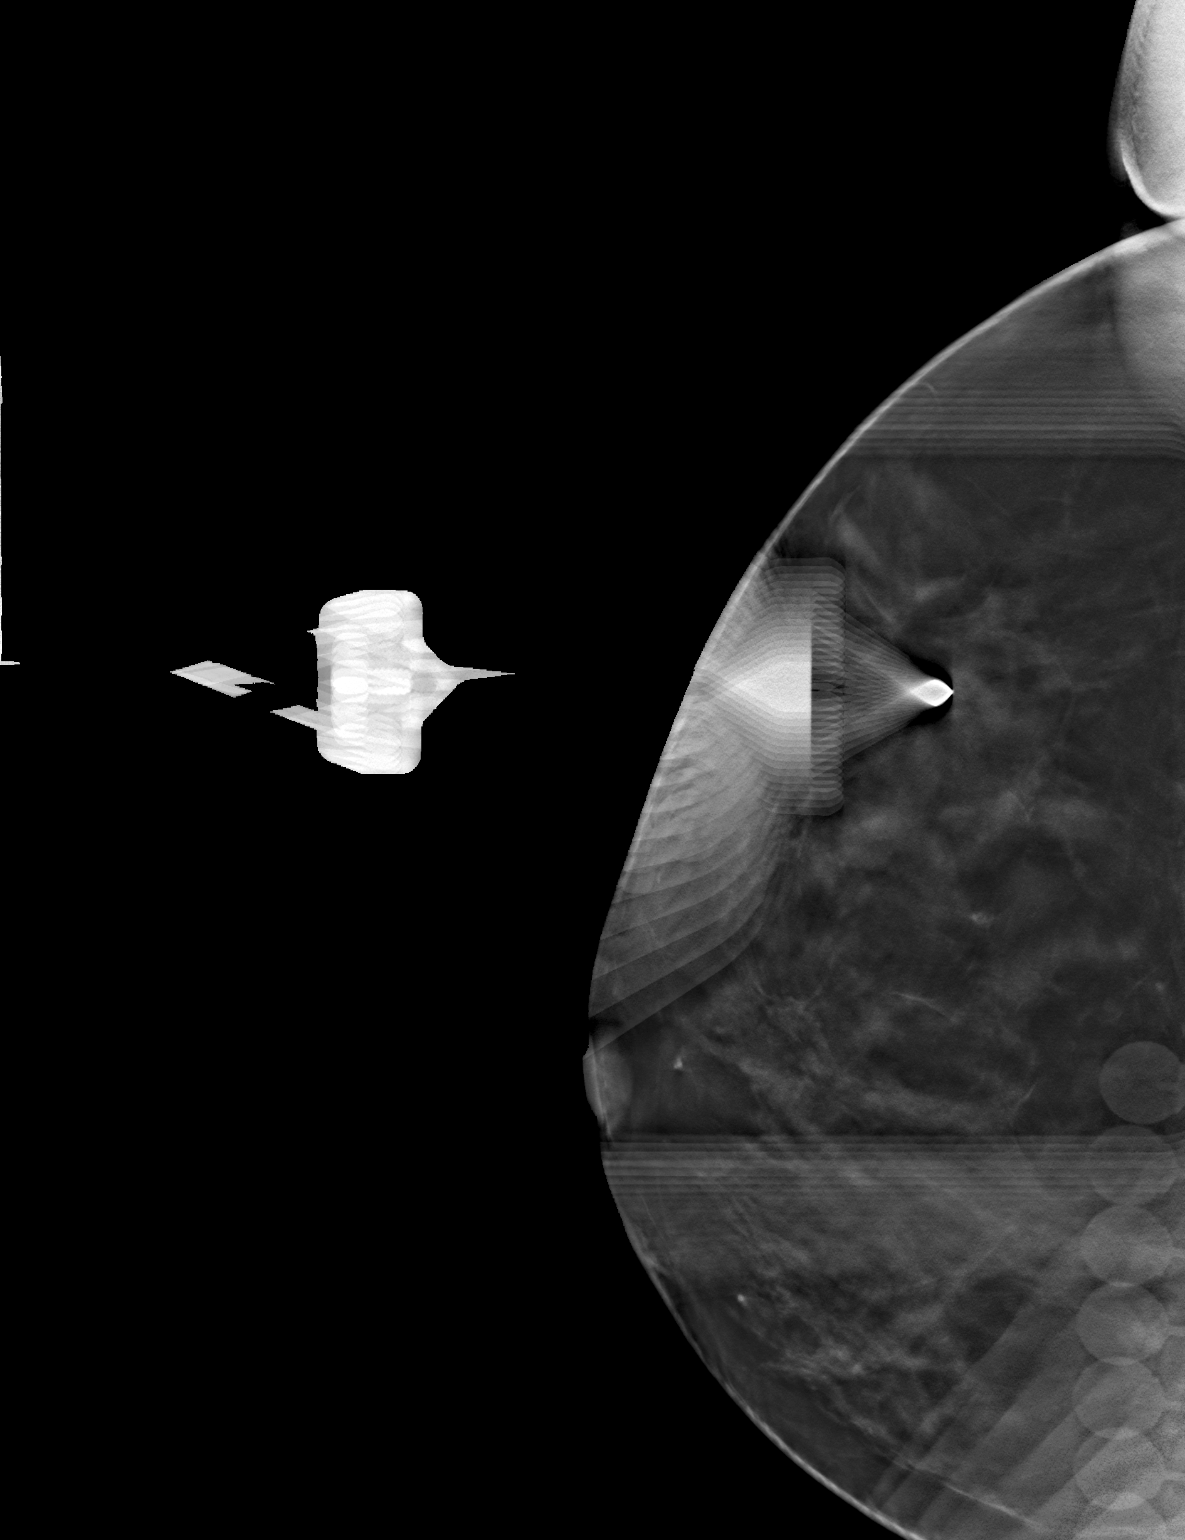

[R CC tomo (2 of 2) · tomo slice 39/77.0]
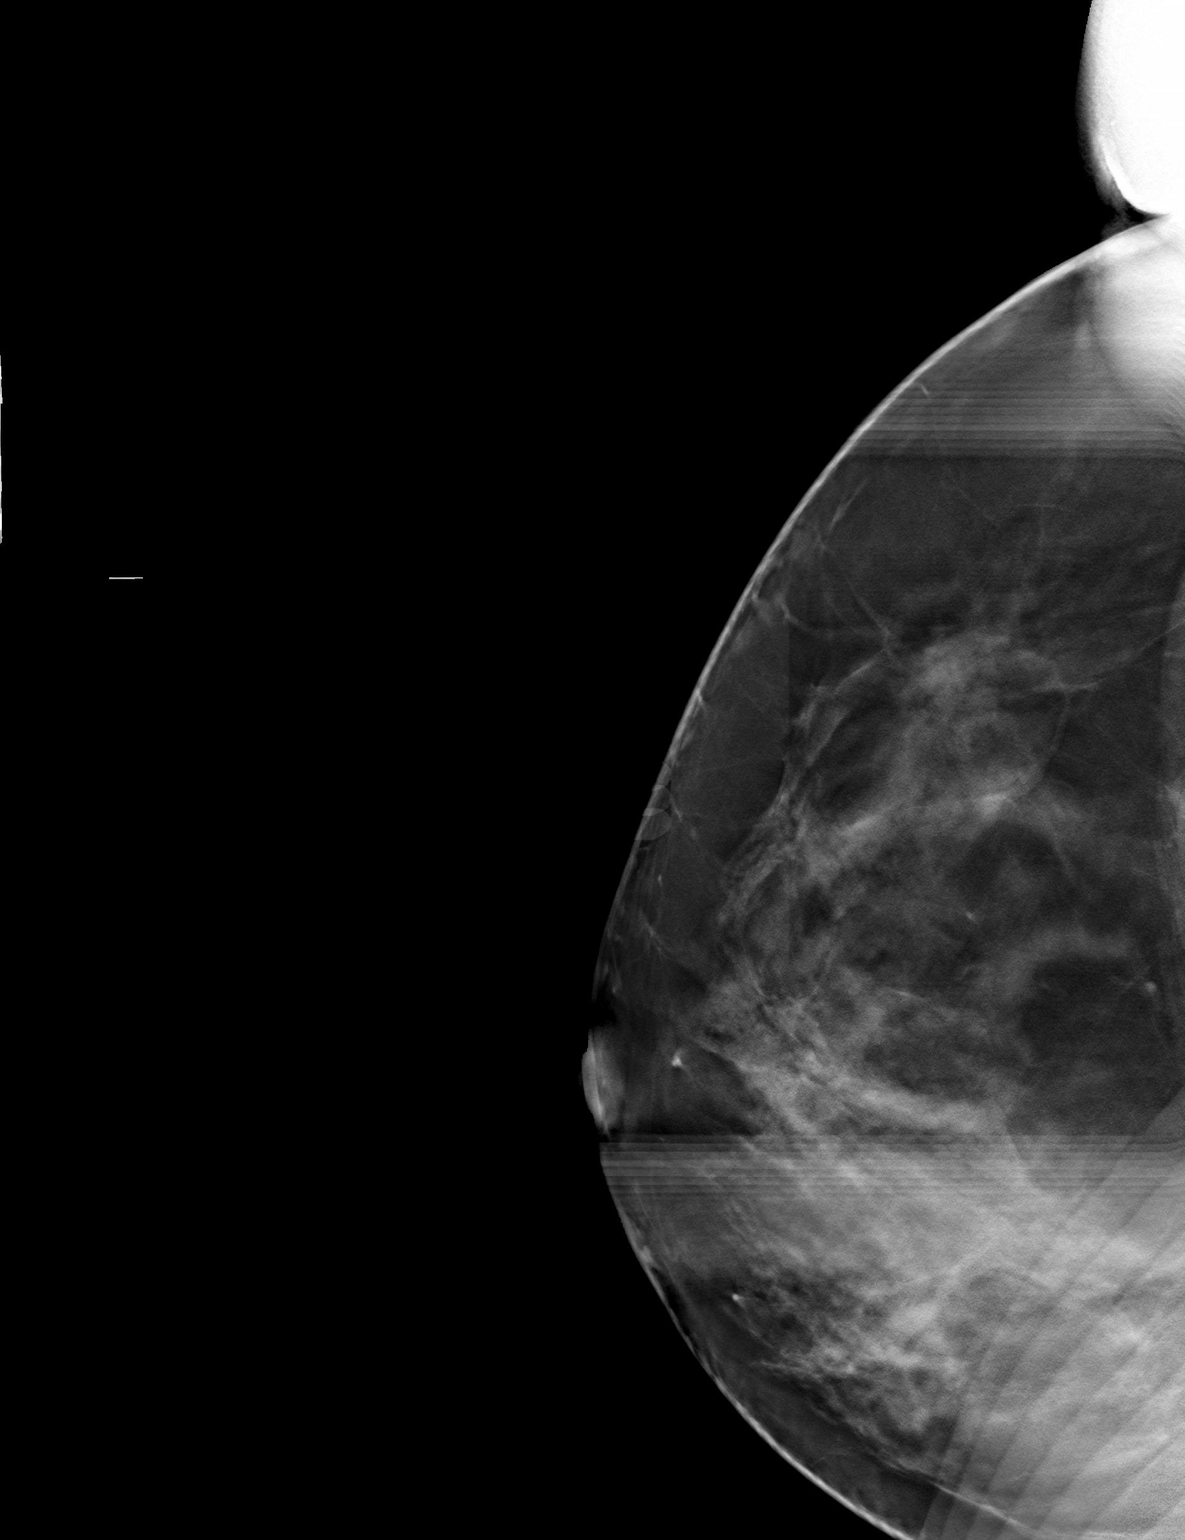

[3 of 11 positions shown; findings below may reference images not displayed]



Using sterile technique and 1% Lidocaine as local anesthetic, under
stereotactic guidance, a 9 gauge vacuum assisted device was used to
perform core needle biopsy of distortion in the upper outer right
breast using a superior approach.

Lesion quadrant: Upper outer right breast

At the conclusion of the procedure, a tissue marker clip was
deployed into the biopsy cavity. Follow-up 2-view mammogram was
performed and dictated separately.
IMPRESSION: Stereotactic-guided biopsy of distortion in the upper outer right
breast. No apparent complications.

## 2019-06-21 ENCOUNTER — Other Ambulatory Visit: Payer: Self-pay | Admitting: Family

## 2019-06-21 ENCOUNTER — Ambulatory Visit
Admission: RE | Admit: 2019-06-21 | Discharge: 2019-06-21 | Disposition: A | Discharge status: Home / Self Care | Payer: 59 | Source: Ambulatory Visit | Attending: Family | Admitting: Family

## 2019-06-21 DIAGNOSIS — R0602 Shortness of breath: Secondary | ICD-10-CM

## 2019-06-21 MED ORDER — IOPAMIDOL (ISOVUE-370) INJECTION 76%
75.0000 mL | Freq: Once | INTRAVENOUS | Status: AC | PRN
  Administered 2019-06-21: 14:00:00 75 mL via INTRAVENOUS

## 2019-06-22 ENCOUNTER — Inpatient Hospital Stay: Admission: RE | Admit: 2019-06-22 | Payer: 59 | Source: Ambulatory Visit

## 2019-08-31 ENCOUNTER — Other Ambulatory Visit: Payer: Self-pay

## 2019-08-31 ENCOUNTER — Other Ambulatory Visit (INDEPENDENT_AMBULATORY_CARE_PROVIDER_SITE_OTHER): Payer: 59

## 2019-08-31 ENCOUNTER — Encounter: Payer: Self-pay | Admitting: Pulmonary Disease

## 2019-08-31 ENCOUNTER — Ambulatory Visit: Payer: 59 | Admitting: Pulmonary Disease

## 2019-08-31 VITALS — BP 122/74 | HR 93 | Temp 98.2°F | Ht 65.0 in | Wt 152.0 lb

## 2019-08-31 DIAGNOSIS — R0600 Dyspnea, unspecified: Secondary | ICD-10-CM

## 2019-08-31 LAB — CBC WITH DIFFERENTIAL/PLATELET
Basophils Absolute: 0 10*3/uL (ref 0.0–0.1)
Basophils Relative: 0.6 % (ref 0.0–3.0)
Eosinophils Absolute: 0 10*3/uL (ref 0.0–0.7)
Eosinophils Relative: 0.3 % (ref 0.0–5.0)
HCT: 42.9 % (ref 36.0–46.0)
Hemoglobin: 14.6 g/dL (ref 12.0–15.0)
Lymphocytes Relative: 22.7 % (ref 12.0–46.0)
Lymphs Abs: 1.3 10*3/uL (ref 0.7–4.0)
MCHC: 34.1 g/dL (ref 30.0–36.0)
MCV: 91.4 fl (ref 78.0–100.0)
Monocytes Absolute: 0.4 10*3/uL (ref 0.1–1.0)
Monocytes Relative: 6.1 % (ref 3.0–12.0)
Neutro Abs: 4.1 10*3/uL (ref 1.4–7.7)
Neutrophils Relative %: 70.3 % (ref 43.0–77.0)
Platelets: 293 10*3/uL (ref 150.0–400.0)
RBC: 4.69 Mil/uL (ref 3.87–5.11)
RDW: 13.2 % (ref 11.5–15.5)
WBC: 5.8 10*3/uL (ref 4.0–10.5)

## 2019-08-31 LAB — SEDIMENTATION RATE: Sed Rate: 39 mm/hr — ABNORMAL HIGH (ref 0–20)

## 2019-08-31 MED ORDER — PREDNISONE 10 MG PO TABS: 10.0000 mg | ORAL_TABLET | Freq: Every day | ORAL | 0 refills | Status: DC

## 2019-08-31 NOTE — Progress Notes (Signed)
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 fexofenadine (ALLEGRA) 180 MG tablet Take 180 mg by mouth daily.  . naratriptan (AMERGE) 2.5 MG tablet Take 2.5 mg by mouth as needed for migraine. Take one (1) tablet at onset of headache; if returns or does not resolve, may repeat after 4 hours; do not exceed five (5) mg in 24 hours.  . Multiple Vitamin (MULTIVITAMIN WITH MINERALS) TABS tablet Take 1 tablet by mouth daily.   No facility-administered encounter medications on file as of 08/31/2019.    Allergies as of 08/31/2019 - Review Complete 08/31/2019  Allergen Reaction Noted  . Aleve [naproxen sodium] Rash 11/25/2016    Past Medical History:  Diagnosis Date  . Breast mass, right   . Breast mass, right   . Headache     Past Surgical History:  Procedure Laterality Date  . ABDOMINAL HYSTERECTOMY    . BREAST BIOPSY Left   . BREAST EXCISIONAL BIOPSY Right   . CESAREAN SECTION    . CHOLECYSTECTOMY    . RADIOACTIVE SEED GUIDED  EXCISIONAL BREAST BIOPSY Right 12/01/2016   Procedure: RIGHT RADIOACTIVE SEED GUIDED EXCISIONAL BREAST BIOPSY;  Surgeon: Rolm Bookbinder, MD;  Location: [facility];  Service: General;  Laterality: Right;  . ROTATOR CUFF REPAIR Right   . TONSILLECTOMY      Family History  Problem Relation Age of Onset  . Breast cancer Mother 37    Social History   Socioeconomic History  . Marital status: Married    Spouse name: Not on file  . Number of children: Not on file  . Years of education: Not on file  . Highest education level: Not on file  Occupational History  . Not on file  Tobacco Use  . Smoking status: Never Smoker  . Smokeless tobacco: Never Used  Substance and Sexual Activity  . Alcohol use: Yes    Comment: social  . Drug use: No  . Sexual activity: Not on file  Other Topics Concern  . Not on file  Social History Narrative  . Not on file   Social Determinants of Health   Financial Resource Strain:   . Difficulty of Paying Living Expenses:   Food Insecurity:   . Worried About Charity fundraiser in the Last Year:   . Arboriculturist in the Last Year:   Transportation Needs:   . Film/video editor (Medical):   Marland Kitchen Lack of Transportation (Non-Medical):   Physical Activity:   .  Days of Exercise per Week:   . Minutes of Exercise per Session:   Stress:   . Feeling of Stress :   Social Connections:   . Frequency of Communication with Friends and Family:   . Frequency of Social Gatherings with Friends and Family:   . Attends Religious Services:   . Active Member of Clubs or Organizations:   . Attends Archivist Meetings:   Marland Kitchen Marital Status:   Intimate Partner Violence:   . Fear of Current or Ex-Partner:   . Emotionally Abused:   Marland Kitchen Physically Abused:   . Sexually Abused:     Review of Systems  Constitutional: Negative.   HENT: Negative.   Respiratory: Positive for shortness of breath.   Psychiatric/Behavioral: Negative.   All other  systems reviewed and are negative.   Vitals:   08/31/19 1204  BP: 122/74  Pulse: 93  Temp: 98.2 F (36.8 C)  SpO2: 96%     Physical Exam  Constitutional: She is oriented to person, place, and time. She appears well-developed and well-nourished.  HENT:  Head: Normocephalic and atraumatic.  Eyes: Pupils are equal, round, and reactive to light.  Neck: No tracheal deviation present. No thyromegaly present.  Cardiovascular: Normal rate and regular rhythm.  Pulmonary/Chest: Effort normal and breath sounds normal. No respiratory distress. She has no wheezes. She has no rales. She exhibits no tenderness.  Musculoskeletal:        General: No edema.     Cervical back: Normal range of motion and neck supple.  Neurological: She is alert and oriented to person, place, and time.  Psychiatric: She has a normal mood and affect.   Data Reviewed: CT scan from February -51m nodule stable from previous, groundglass changes as reviewed by myself-reviewed with the patient  Assessment:  Shortness of breath persistent post Covid infection -Did get relief with symptoms with use of steroids in the past -No underlying bronchospastic lung disease -Use of inhalers have not really helped much -Albuterol does help but does cause some jitteriness  History of allergies  Plan/Recommendations: We will start prednisone at 30 mg daily, decrease to 20 after 7 days, then after 7:10 days then 5 mg daily  Obtain hypersensitivity panel, CBC, chest x-ray prior to next visit, ESR  We will consider repeat CT if not improvement of symptoms  He may be considered however, will be suboptimal study because of current shortness of breath and coughing spasms  Encouraged to call with any significant concerns   ASherrilyn RistMD [facility] 08/31/2019, 12:28 PM  CC: PGreig Right MD

## 2019-08-31 NOTE — Patient Instructions (Signed)
Shortness of breath persistent since Covid infection  Tapering course of steroids Prednisone 30 mg for 1 week, 20 mg for 1 week, 10 mg for 1 week, 5 mg-14-day supply but can be stopped after about 7 days  We will get blood work Hypersensitivity panel  Breathing study down the line depending on how you are feeling You may stop breath striae present  Rescue inhaler use as needed  Call with significant concerns  I will see you in about 4 weeks

## 2019-10-05 ENCOUNTER — Ambulatory Visit (INDEPENDENT_AMBULATORY_CARE_PROVIDER_SITE_OTHER): Payer: 59

## 2019-10-05 ENCOUNTER — Other Ambulatory Visit: Payer: Self-pay

## 2019-10-05 ENCOUNTER — Encounter: Payer: Self-pay | Admitting: Pulmonary Disease

## 2019-10-05 ENCOUNTER — Ambulatory Visit: Payer: 59 | Admitting: Pulmonary Disease

## 2019-10-05 VITALS — BP 112/70 | HR 61 | Temp 98.4°F | Ht 65.0 in | Wt 156.7 lb

## 2019-10-05 DIAGNOSIS — R0602 Shortness of breath: Secondary | ICD-10-CM | POA: Diagnosis not present

## 2019-10-05 DIAGNOSIS — R0600 Dyspnea, unspecified: Secondary | ICD-10-CM

## 2019-10-05 MED ORDER — PREDNISONE 5 MG PO TABS: 5.0000 mg | ORAL_TABLET | Freq: Every day | ORAL | 0 refills | Status: DC

## 2019-10-05 NOTE — Patient Instructions (Signed)
We will get you back on a tapering dose of steroids  Call with any significant concerns  Use the course of steroids as we discussed  Follow-up appointment in 3 months

## 2019-10-05 NOTE — Progress Notes (Signed)
Brooke Haas    [id number]    01/05/1971  Primary Care Physician:Penner, Brooke Hauser, MD  Referring Physician: Greig Right, MD [address] 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 fexofenadine (ALLEGRA) 180 MG tablet Take 180 mg by mouth daily.  . naratriptan (AMERGE) 2.5 MG tablet Take 2.5 mg by mouth as needed for migraine. Take one (1) tablet at onset of headache; if returns or does not resolve, may repeat after 4 hours; do not exceed five (5) mg in 24 hours.  . predniSONE (DELTASONE) 10 MG tablet Take 1 tablet (10 mg total) by mouth daily with breakfast. 30mg  7 days, 20mg  7 days, 10mg  7 days, 5 mg 14 days   No facility-administered encounter medications on file as of 10/05/2019.    Allergies as of 10/05/2019 - Review Complete 10/05/2019  Allergen Reaction Noted  . Aleve [naproxen sodium] Rash 11/25/2016    Past Medical History:  Diagnosis Date  . Breast mass, Haas   . Breast mass, Haas   . Headache     Past Surgical History:  Procedure Laterality Date  . ABDOMINAL HYSTERECTOMY    . BREAST BIOPSY Left   . BREAST EXCISIONAL BIOPSY Haas    . CESAREAN SECTION    . CHOLECYSTECTOMY    . RADIOACTIVE SEED GUIDED EXCISIONAL BREAST BIOPSY Haas 12/01/2016   Procedure: Haas RADIOACTIVE SEED GUIDED EXCISIONAL BREAST BIOPSY;  Surgeon: Rolm Bookbinder, MD;  Location: [facility];  Service: General;  Laterality: Haas;  . ROTATOR CUFF REPAIR Haas   . TONSILLECTOMY      Family History  Problem Relation Age of Onset  . Breast cancer Mother 12    Social History   Socioeconomic History  . Marital status: Married    Spouse name: Not on file  . Number of children: Not on file  . Years of education: Not on file  . Highest education level: Not on file  Occupational History  . Not on file  Tobacco Use  . Smoking status: Never Smoker  . Smokeless tobacco: Never Used  Substance and Sexual Activity  . Alcohol use: Yes    Comment: social  . Drug use: No  . Sexual activity: Not on file  Other Topics Concern  . Not on file  Social History Narrative  . Not on file   Social Determinants of Health   Financial Resource Strain:   . Difficulty of Paying Living Expenses:   Food Insecurity:   . Worried About Charity fundraiser in the Last Year:   . [place] in the Last  Year:   Transportation Needs:   . Film/video editor (Medical):   Marland Kitchen Lack of Transportation (Non-Medical):   Physical Activity:   . Days of Exercise per Week:   . Minutes of Exercise per Session:   Stress:   . Feeling of Stress :   Social Connections:   . Frequency of Communication with Friends and Family:   . Frequency of Social Gatherings with Friends and Family:   . Attends Religious Services:   . Active Member of Clubs or Organizations:   . Attends Archivist Meetings:   Marland Kitchen Marital Status:   Intimate Partner Violence:   . Fear of Current or Ex-Partner:   . Emotionally Abused:   Marland Kitchen Physically Abused:   . Sexually Abused:     Review of Systems  Constitutional: Negative.   HENT: Negative.   Respiratory: Positive  for shortness of breath.   Psychiatric/Behavioral: Negative.   All other systems reviewed and are negative.   Vitals:   10/05/19 1049  BP: 112/70  Pulse: 61  Temp: 98.4 F (36.9 C)  SpO2: 99%     Physical Exam  Constitutional: She is oriented to person, place, and time. She appears well-developed and well-nourished.  HENT:  Head: Normocephalic and atraumatic.  Eyes: Pupils are equal, round, and reactive to light.  Neck: No tracheal deviation present. No thyromegaly present.  Cardiovascular: Normal rate and regular rhythm.  Pulmonary/Chest: Effort normal and breath sounds normal. No respiratory distress. She has no wheezes. She has no rales. She exhibits no tenderness.  Musculoskeletal:     Cervical back: Normal range of motion and neck supple.  Neurological: She is alert and oriented to person, place, and time.  Psychiatric: She has a normal mood and affect.   Data Reviewed: CT scan from February -71mm nodule stable from previous, groundglass changes as reviewed by myself-reviewed with the patient  Assessment:  Shortness of breath persistent post Covid infection -Steroids did help -More gradual tapering of the steroids discussed with her today   History of allergies  Plan/Recommendations: Prednisone 5 mg every other day for 1 to 2 weeks and then 2.5 mg every other day for 1 to 2 weeks and then try to stop   Encourage activity as tolerated Symptoms did improve significantly enough that I do not believe there is any reason to do any further testing at present  Tentative follow-up in 3 months  Encouraged to call with any significant concerns  Sherrilyn Rist MD [facility] 10/05/2019, 11:11 AM  CC: Brooke Right, MD

## 2019-10-05 NOTE — Progress Notes (Signed)
Brooke Haas    [id number]    Mar 16, 1971  Primary Care Physician:Penner, Olin Hauser, MD  Referring Physician: Greig Right, MD [address] 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 fexofenadine (ALLEGRA) 180 MG tablet Take 180 mg by mouth daily.  . naratriptan (AMERGE) 2.5 MG tablet Take 2.5 mg by mouth as needed for migraine. Take one (1) tablet at onset of headache; if returns or does not resolve, may repeat after 4 hours; do not exceed five (5) mg in 24 hours.  . predniSONE (DELTASONE) 10 MG tablet Take 1 tablet (10 mg total) by mouth daily with breakfast. 84m 7 days, 286m7 days, 1023m days, 5 mg 14 days   No facility-administered encounter medications on file as of 10/05/2019.    Allergies as of 10/05/2019 - Review Complete 10/05/2019  Allergen Reaction Noted  . Aleve [naproxen sodium] Rash 11/25/2016    Past Medical History:  Diagnosis Date  . Breast mass, right   . Breast mass, right   . Headache     Past Surgical History:  Procedure Laterality Date  . ABDOMINAL HYSTERECTOMY    . BREAST BIOPSY Left   . BREAST EXCISIONAL BIOPSY Right   . CESAREAN SECTION     . CHOLECYSTECTOMY    . RADIOACTIVE SEED GUIDED EXCISIONAL BREAST BIOPSY Right 12/01/2016   Procedure: RIGHT RADIOACTIVE SEED GUIDED EXCISIONAL BREAST BIOPSY;  Surgeon: WakRolm BookbinderD;  [facility];  Laterality: Right;  . ROTATOR CUFF REPAIR Right   . TONSILLECTOMY      Family History  Problem Relation Age of Onset  . Breast cancer Mother 45 39 Social History   Socioeconomic History  . Marital status: Married    Spouse name: Not on file  . Number of children: Not on file  . Years of education: Not on file  . Highest education level: Not on file  Occupational History  . Not on file  Tobacco Use  . Smoking status: Never Smoker  . Smokeless tobacco: Never Used  Substance and Sexual Activity  . Alcohol use: Yes    Comment: social  . Drug use: No  . Sexual activity: Not on file  Other Topics Concern  . Not on file  Social History Narrative  . Not on file   Social Determinants of Health   Financial Resource Strain:   . Difficulty of Paying Living Expenses:   Food Insecurity:   . Worried About RunCharity fundraiser the Last Year:   . [place] the Last Year:   Transportation Needs:   . Lack of  Transportation (Medical):   Marland Kitchen Lack of Transportation (Non-Medical):   Physical Activity:   . Days of Exercise per Week:   . Minutes of Exercise per Session:   Stress:   . Feeling of Stress :   Social Connections:   . Frequency of Communication with Friends and Family:   . Frequency of Social Gatherings with Friends and Family:   . Attends Religious Services:   . Active Member of Clubs or Organizations:   . Attends Archivist Meetings:   Marland Kitchen Marital Status:   Intimate Partner Violence:   . Fear of Current or Ex-Partner:   . Emotionally Abused:   Marland Kitchen Physically Abused:   . Sexually Abused:     Review of Systems  Constitutional: Negative.   HENT: Negative.   Respiratory: Positive for shortness of breath.    Psychiatric/Behavioral: Negative.   All other systems reviewed and are negative.   Vitals:   10/05/19 1049  BP: 112/70  Pulse: 61  Temp: 98.4 F (36.9 C)  SpO2: 99%     Physical Exam  Constitutional: She is oriented to person, place, and time. She appears well-developed and well-nourished.  HENT:  Head: Normocephalic and atraumatic.  Eyes: Pupils are equal, round, and reactive to light.  Neck: No tracheal deviation present. No thyromegaly present.  Cardiovascular: Normal rate and regular rhythm.  Pulmonary/Chest: Effort normal and breath sounds normal. No respiratory distress. She has no wheezes. She has no rales. She exhibits no tenderness.  Musculoskeletal:        General: No edema.     Cervical back: Normal range of motion and neck supple.  Neurological: She is alert and oriented to person, place, and time.  Psychiatric: She has a normal mood and affect.   Data Reviewed: CT scan from February -83m nodule stable from previous, groundglass changes as reviewed by myself-reviewed with the patient  Assessment:  Shortness of breath persistent post Covid infection -Did get relief with symptoms with use of steroids in the past -No underlying bronchospastic lung disease -Use of inhalers have not really helped much -Albuterol does help but does cause some jitteriness  History of allergies  Plan/Recommendations: We will start prednisone at 30 mg daily, decrease to 20 after 7 days, then after 7:10 days then 5 mg daily  Obtain hypersensitivity panel, CBC, chest x-ray prior to next visit, ESR  We will consider repeat CT if not improvement of symptoms  He may be considered however, will be suboptimal study because of current shortness of breath and coughing spasms  Encouraged to call with any significant concerns   ASherrilyn RistMD [facility] 10/05/2019, 11:15 AM  CC: PGreig Right MD

## 2019-10-25 ENCOUNTER — Other Ambulatory Visit: Payer: Self-pay | Admitting: Obstetrics and Gynecology

## 2019-10-25 DIAGNOSIS — Z1231 Encounter for screening mammogram for malignant neoplasm of breast: Secondary | ICD-10-CM

## 2019-11-08 ENCOUNTER — Other Ambulatory Visit: Payer: Self-pay

## 2019-11-08 ENCOUNTER — Ambulatory Visit
Admission: RE | Admit: 2019-11-08 | Discharge: 2019-11-08 | Disposition: A | Discharge status: Home / Self Care | Payer: 59 | Source: Ambulatory Visit | Attending: Obstetrics and Gynecology | Admitting: Obstetrics and Gynecology

## 2019-11-08 DIAGNOSIS — Z1231 Encounter for screening mammogram for malignant neoplasm of breast: Secondary | ICD-10-CM

## 2020-02-15 ENCOUNTER — Other Ambulatory Visit: Payer: Self-pay

## 2020-02-15 ENCOUNTER — Encounter: Payer: Self-pay | Admitting: Pulmonary Disease

## 2020-02-15 ENCOUNTER — Ambulatory Visit (INDEPENDENT_AMBULATORY_CARE_PROVIDER_SITE_OTHER): Payer: 59 | Admitting: Pulmonary Disease

## 2020-02-15 VITALS — BP 118/78 | HR 80 | Temp 97.2°F | Ht 65.0 in | Wt 151.4 lb

## 2020-02-15 DIAGNOSIS — R0602 Shortness of breath: Secondary | ICD-10-CM

## 2020-02-15 NOTE — Patient Instructions (Signed)
Shortness of breath  Post Covid syndrome  Continue graded exercises as tolerated  As we discussed today -Options of a pulmonary function test, repeating CT -May not necessarily move the needle forward -Inhalers have not helped in the past  Call with any significant concerns  I will see you in 6 months

## 2020-02-15 NOTE — Progress Notes (Signed)
Brooke Haas    [id number]    1970/05/11  Primary Care Physician:Penner, Olin Hauser, MD  Referring Physician: Greig Right, MD [address] 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 fexofenadine (ALLEGRA) 180 MG tablet Take 180 mg by mouth daily.  . naratriptan (AMERGE) 2.5 MG tablet Take 2.5 mg by mouth as needed for migraine. Take one (1) tablet at onset of headache; if returns or does not resolve, may repeat after 4 hours; do not exceed five (5) mg in 24 hours.  . predniSONE (DELTASONE) 10 MG tablet Take 1 tablet (10 mg total) by mouth daily with breakfast. 30mg  7 days, 20mg  7 days, 10mg  7 days, 5 mg 14 days  . predniSONE (DELTASONE) 5 MG tablet Take 1 tablet (5 mg total) by mouth daily with breakfast.   No facility-administered encounter medications on file as of 02/15/2020.    Allergies as of 02/15/2020 - Review Complete 02/15/2020  Allergen Reaction Noted  . Aleve [naproxen sodium] Rash 11/25/2016    Past Medical History:  Diagnosis Date  . Breast mass, right   . Breast mass, right   . Headache     Past Surgical History:  Procedure  Laterality Date  . ABDOMINAL HYSTERECTOMY    . BREAST BIOPSY Left   . BREAST EXCISIONAL BIOPSY Right   . CESAREAN SECTION    . CHOLECYSTECTOMY    . RADIOACTIVE SEED GUIDED EXCISIONAL BREAST BIOPSY Right 12/01/2016   Procedure: RIGHT RADIOACTIVE SEED GUIDED EXCISIONAL BREAST BIOPSY;  Surgeon: Rolm Bookbinder, MD;  Location: [facility];  Service: General;  Laterality: Right;  . ROTATOR CUFF REPAIR Right   . TONSILLECTOMY      Family History  Problem Relation Age of Onset  . Breast cancer Mother 17    Social History   Socioeconomic History  . Marital status: Married    Spouse name: Not on file  . Number of children: Not on file  . Years of education: Not on file  . Highest education level: Not on file  Occupational History  . Not on file  Tobacco Use  . Smoking status: Never Smoker  . Smokeless tobacco: Never Used  Substance and Sexual Activity  . Alcohol use: Yes    Comment: social  . Drug use: No  . Sexual activity: Not on file  Other Topics Concern  . Not on file  Social History Narrative  . Not on file   Social Determinants of Health   Financial Resource Strain:   . Difficulty of Paying Living Expenses: Not on file  Food Insecurity:   . Worried About [employer] of  Food in the Last Year: Not on file  . Ran Out of Food in the Last Year: Not on file  Transportation Needs:   . Lack of Transportation (Medical): Not on file  . Lack of Transportation (Non-Medical): Not on file  Physical Activity:   . Days of Exercise per Week: Not on file  . Minutes of Exercise per Session: Not on file  Stress:   . Feeling of Stress : Not on file  Social Connections:   . Frequency of Communication with Friends and Family: Not on file  . Frequency of Social Gatherings with Friends and Family: Not on file  . Attends Religious Services: Not on file  . Active Member of Clubs or Organizations: Not on file  . Attends Archivist Meetings: Not on file  .  Marital Status: Not on file  Intimate Partner Violence:   . Fear of Current or Ex-Partner: Not on file  . Emotionally Abused: Not on file  . Physically Abused: Not on file  . Sexually Abused: Not on file    Review of Systems  Constitutional: Negative.   HENT: Negative.   Respiratory: Positive for shortness of breath.   Psychiatric/Behavioral: Negative.   All other systems reviewed and are negative.   Vitals:   02/15/20 1509  BP: 118/78  Pulse: 80  Temp: (!) 97.2 F (36.2 C)  SpO2: 98%     Physical Exam Constitutional:      Appearance: She is well-developed.  HENT:     Head: Normocephalic and atraumatic.  Eyes:     Pupils: Pupils are equal, round, and reactive to light.  Neck:     Thyroid: No thyromegaly.     Trachea: No tracheal deviation.  Cardiovascular:     Rate and Rhythm: Normal rate and regular rhythm.  Pulmonary:     Effort: Pulmonary effort is normal. No respiratory distress.     Breath sounds: Normal breath sounds. No stridor. No wheezing or rhonchi.  Musculoskeletal:     Cervical back: No rigidity or tenderness.  Neurological:     Mental Status: She is alert and oriented to person, place, and time.    Data Reviewed: CT scan from February -35mm nodule stable from previous, groundglass changes as reviewed by myself-reviewed with the patient  Assessment:  Shortness of breath persistent post Covid infection -Symptoms are gradually stabilized -Takes a lot more treatment to get her short of breath -Inhalers did not help in the past  Post Covid fatigue   Plan/Recommendations: Continue with activity as tolerated  Follow-up in 6 months  Encouraged to call with any significant concerns  PFT, repeat CT may be considered if worsening symptoms   Sherrilyn Rist MD [facility] 02/15/2020, 3:14 PM  CC: Greig Right, MD

## 2020-07-25 DIAGNOSIS — M25511 Pain in right shoulder: Secondary | ICD-10-CM | POA: Diagnosis not present

## 2020-07-25 DIAGNOSIS — G8929 Other chronic pain: Secondary | ICD-10-CM | POA: Diagnosis not present

## 2020-08-15 ENCOUNTER — Other Ambulatory Visit: Payer: Self-pay | Admitting: Obstetrics and Gynecology

## 2020-08-15 DIAGNOSIS — Z9189 Other specified personal risk factors, not elsewhere classified: Secondary | ICD-10-CM

## 2020-08-15 DIAGNOSIS — N6489 Other specified disorders of breast: Secondary | ICD-10-CM

## 2020-08-23 ENCOUNTER — Other Ambulatory Visit: Payer: Self-pay

## 2020-08-23 ENCOUNTER — Ambulatory Visit (INDEPENDENT_AMBULATORY_CARE_PROVIDER_SITE_OTHER): Payer: BC Managed Care – PPO | Admitting: Adult Health

## 2020-08-23 ENCOUNTER — Encounter: Payer: Self-pay | Admitting: Adult Health

## 2020-08-23 VITALS — BP 120/80 | HR 85 | Ht 65.0 in | Wt 147.0 lb

## 2020-08-23 DIAGNOSIS — K219 Gastro-esophageal reflux disease without esophagitis: Secondary | ICD-10-CM | POA: Diagnosis not present

## 2020-08-23 DIAGNOSIS — J45909 Unspecified asthma, uncomplicated: Secondary | ICD-10-CM | POA: Insufficient documentation

## 2020-08-23 DIAGNOSIS — J302 Other seasonal allergic rhinitis: Secondary | ICD-10-CM

## 2020-08-23 DIAGNOSIS — J4531 Mild persistent asthma with (acute) exacerbation: Secondary | ICD-10-CM | POA: Diagnosis not present

## 2020-08-23 LAB — CBC WITH DIFFERENTIAL/PLATELET
Basophils Absolute: 0 10*3/uL (ref 0.0–0.1)
Basophils Relative: 0.5 % (ref 0.0–3.0)
Eosinophils Absolute: 0.1 10*3/uL (ref 0.0–0.7)
Eosinophils Relative: 1 % (ref 0.0–5.0)
HCT: 40.5 % (ref 36.0–46.0)
Hemoglobin: 13.6 g/dL (ref 12.0–15.0)
Lymphocytes Relative: 32 % (ref 12.0–46.0)
Lymphs Abs: 2.1 10*3/uL (ref 0.7–4.0)
MCHC: 33.6 g/dL (ref 30.0–36.0)
MCV: 91.8 fl (ref 78.0–100.0)
Monocytes Absolute: 0.5 10*3/uL (ref 0.1–1.0)
Monocytes Relative: 7.8 % (ref 3.0–12.0)
Neutro Abs: 3.9 10*3/uL (ref 1.4–7.7)
Neutrophils Relative %: 58.7 % (ref 43.0–77.0)
Platelets: 227 10*3/uL (ref 150.0–400.0)
RBC: 4.41 Mil/uL (ref 3.87–5.11)
RDW: 13.5 % (ref 11.5–15.5)
WBC: 6.6 10*3/uL (ref 4.0–10.5)

## 2020-08-23 NOTE — Progress Notes (Signed)
@Patient  ID: Brooke Haas, female    DOB: [birth year], 50 y.o.   MRN: [id number]  Chief Complaint  Patient presents with  . Follow-up    Referring provider: Greig Right, MD  HPI: 50 year old female never smoker seen for pulmonary consult May 2021 for shortness of breath post COVID-19 infection in January 2021. Patient is a pharmacist  TEST/EVENTS :   08/23/2020 Follow up : Dyspnea  Patient returns for a follow-up visit.  Last seen October 2021.  Patient had COVID-19 infection in January 2021.  Post this infection she had ongoing shortness of breath and intermittent cough and sinus congestion.  She did receive several courses of steroids to help with symptoms after COVID-19.  Patient says it took her several months to recover but she felt like she had returned back to her baseline and was having no significant symptoms of shortness of breath or coughing.  However about 4 weeks ago she says she caught some type of upper respiratory infection with cough shortness of breath wheezing and sinus drainage.  Has some sinus fullness.  She had previously been on Symbicort and restarted this along with Allegra and Flonase.  She says that she is starting to feel better and especially over the last week symptoms have improved significantly.  She says she typically gets seasonal allergies but it seems to take her longer to recover.  She says in the past after she is got no cold-like symptoms it takes her a while to get better and she typically has some ongoing cough and sinus issues.  Patient does have 2 dogs that are inside.  She does not smoke.  She has a basement but no obvious mold issues.  There is no hot tub or birds.  No unusual travel or hobbies.  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     Allergies  Allergen Reactions  . Aleve [Naproxen Sodium] Rash    Immunization History  Administered Date(s) Administered  . Influenza,inj,quad, With Preservative 02/02/2018  .  Influenza-Unspecified 01/25/2020  . Janssen (J&J) SARS-COV-2 Vaccination 02/15/2020    Past Medical History:  Diagnosis Date  . Breast mass, right   . Breast mass, right   . Headache     Tobacco History: Social History   Tobacco Use  Smoking Status Never Smoker  Smokeless Tobacco Never Used   Counseling given: Not Answered   Outpatient Medications Prior to Visit  Medication Sig Dispense Refill  . albuterol (VENTOLIN HFA) 108 (90 Base) MCG/ACT inhaler Inhale into the lungs every 6 (six) hours as needed for wheezing or shortness of breath.    . budesonide-formoterol (SYMBICORT) 80-4.5 MCG/ACT inhaler Inhale 2 puffs into the lungs 2 (two) times daily.    . Cholecalciferol 100 MCG (4000 UT) CAPS Take by mouth.    . estradiol (ESTRACE) 0.1 MG/GM vaginal cream Place vaginally.    . fexofenadine (ALLEGRA) 180 MG tablet Take 180 mg by mouth daily.    . Fluticasone Propionate (FLONASE NA) Place into the nose.    . Multiple Vitamin (MULTIVITAMIN) capsule Take 1 capsule by mouth daily.    . naratriptan (AMERGE) 2.5 MG tablet Take 2.5 mg by mouth as needed for migraine. Take one (1) tablet at onset of headache; if returns or does not resolve, may repeat after 4 hours; do not exceed five (5) mg in 24 hours.    . predniSONE (DELTASONE) 10 MG tablet Take 1 tablet (10 mg total) by mouth daily with breakfast. 30mg  7  days, 20mg  7 days, 10mg  7 days, 5 mg 14 days 49 tablet 0  . predniSONE (DELTASONE) 5 MG tablet Take 1 tablet (5 mg total) by mouth daily with breakfast. 30 tablet 0   No facility-administered medications prior to visit.     Review of Systems:   Constitutional:   No  weight loss, night sweats,  Fevers, chills, fatigue, or  lassitude.  HEENT:   No headaches,  Difficulty swallowing,  Tooth/dental problems, or  Sore throat,                No sneezing, itching, ear ache, + nasal congestion, post nasal drip,   CV:  No chest pain,  Orthopnea, PND, swelling in lower extremities,  anasarca, dizziness, palpitations, syncope.   GI  No  abdominal pain, nausea, vomiting, diarrhea, change in bowel habits, loss of appetite, bloody stools. +gerd   Resp:  .  No chest wall deformity  Skin: no rash or lesions.  GU: no dysuria, change in color of urine, no urgency or frequency.  No flank pain, no hematuria   MS:  No joint pain or swelling.  No decreased range of motion.  No back pain.    Physical Exam  BP 120/80   Pulse 85   Ht 5\' 5"  (1.651 m)   Wt 147 lb (66.7 kg)   SpO2 100%   BMI 24.46 kg/m   GEN: A/Ox3; pleasant , NAD, well nourished    HEENT:  [place]/AT,  EACs-clear, TMs-wnl, NOSE-clear drainage , THROAT-clear, no lesions, no postnasal drip or exudate noted.   NECK:  Supple w/ fair ROM; no JVD; normal carotid impulses w/o bruits; no thyromegaly or nodules palpated; no lymphadenopathy.    RESP  Clear  P & A; w/o, wheezes/ rales/ or rhonchi. no accessory muscle use, no dullness to percussion  CARD:  RRR, no m/r/g, no peripheral edema, pulses intact, no cyanosis or clubbing.  GI:   Soft & nt; nml bowel sounds; no organomegaly or masses detected.   Musco: Warm bil, no deformities or joint swelling noted.   Neuro: alert, no focal deficits noted.    Skin: Warm, no lesions or rashes    Lab Results:  CBC    Component Value Date/Time   WBC 5.8 08/31/2019 1342   RBC 4.69 08/31/2019 1342   HGB 14.6 08/31/2019 1342   HCT 42.9 08/31/2019 1342   PLT 293.0 08/31/2019 1342   MCV 91.4 08/31/2019 1342   MCHC 34.1 08/31/2019 1342   RDW 13.2 08/31/2019 1342   LYMPHSABS 1.3 08/31/2019 1342   MONOABS 0.4 08/31/2019 1342   EOSABS 0.0 08/31/2019 1342   BASOSABS 0.0 08/31/2019 1342    BMET No results found for: NA, K, CL, CO2, GLUCOSE, BUN, CREATININE, CALCIUM, GFRNONAA, GFRAA  BNP No results found for: BNP  ProBNP No results found for: PROBNP  Imaging: No results found.    No flowsheet data found.  No results found for:  NITRICOXIDE      Assessment & Plan:   Seasonal allergies Flare  Plan  Patient Instructions  Continue on Symbicort 2 puffs Twice daily  , rinse after use.  Continue on Allegra and Flonase daily  Add Pepcid 20mg  At bedtime   Albuterol inhaler As needed   Labs today  Follow up with Dr. Ander Slade or Akia Montalban NP in 6 weeks with PFT and As needed   Please contact office for sooner follow up if symptoms do not improve or worsen or seek emergency care  Asthma Symptoms are suspicious for underlying asthma.  Versus reactive airways. Check lab work with CBC with differential and allergy panel with IgE.  Previous chest x-ray and a never smoker was unremarkable in June 2021. Check PFTs on return  Plan  Patient Instructions  Continue on Symbicort 2 puffs Twice daily  , rinse after use.  Continue on Allegra and Flonase daily  Add Pepcid 20mg  At bedtime   Albuterol inhaler As needed   Labs today  Follow up with Dr. Ander Slade or Muzamil Harker NP in 6 weeks with PFT and As needed   Please contact office for sooner follow up if symptoms do not improve or worsen or seek emergency care       GERD (gastroesophageal reflux disease) Does complain of GERD symptoms.  We will add Pepcid 20 mg at bedtime     Rexene Edison, NP 08/23/2020

## 2020-08-23 NOTE — Assessment & Plan Note (Signed)
Symptoms are suspicious for underlying asthma.  Versus reactive airways. Check lab work with CBC with differential and allergy panel with IgE.  Previous chest x-ray and a never smoker was unremarkable in June 2021. Check PFTs on return  Plan  Patient Instructions  Continue on Symbicort 2 puffs Twice daily  , rinse after use.  Continue on Allegra and Flonase daily  Add Pepcid 20mg  At bedtime   Albuterol inhaler As needed   Labs today  Follow up with Dr. Ander Slade or Canary Fister NP in 6 weeks with PFT and As needed   Please contact office for sooner follow up if symptoms do not improve or worsen or seek emergency care

## 2020-08-23 NOTE — Assessment & Plan Note (Signed)
Does complain of GERD symptoms.  We will add Pepcid 20 mg at bedtime

## 2020-08-23 NOTE — Patient Instructions (Addendum)
Continue on Symbicort 2 puffs Twice daily  , rinse after use.  Continue on Allegra and Flonase daily  Add Pepcid 20mg  At bedtime   Albuterol inhaler As needed   Labs today  Follow up with Dr. Ander Slade or Vedh Ptacek NP in 6 weeks with PFT and As needed   Please contact office for sooner follow up if symptoms do not improve or worsen or seek emergency care

## 2020-08-23 NOTE — Assessment & Plan Note (Signed)
Flare  Plan  Patient Instructions  Continue on Symbicort 2 puffs Twice daily  , rinse after use.  Continue on Allegra and Flonase daily  Add Pepcid 20mg  At bedtime   Albuterol inhaler As needed   Labs today  Follow up with Dr. Ander Slade or Cortlin Marano NP in 6 weeks with PFT and As needed   Please contact office for sooner follow up if symptoms do not improve or worsen or seek emergency care

## 2020-08-24 LAB — RESPIRATORY ALLERGY PROFILE REGION II ~~LOC~~

## 2020-08-24 LAB — INTERPRETATION:

## 2020-08-28 ENCOUNTER — Other Ambulatory Visit: Payer: Self-pay

## 2020-08-28 ENCOUNTER — Ambulatory Visit
Admission: RE | Admit: 2020-08-28 | Discharge: 2020-08-28 | Disposition: A | Discharge status: Home / Self Care | Payer: BC Managed Care – PPO | Source: Ambulatory Visit | Attending: Obstetrics and Gynecology | Admitting: Obstetrics and Gynecology

## 2020-08-28 DIAGNOSIS — Z9189 Other specified personal risk factors, not elsewhere classified: Secondary | ICD-10-CM

## 2020-08-28 DIAGNOSIS — Z803 Family history of malignant neoplasm of breast: Secondary | ICD-10-CM | POA: Diagnosis not present

## 2020-08-28 DIAGNOSIS — N6489 Other specified disorders of breast: Secondary | ICD-10-CM

## 2020-08-28 MED ORDER — GADOBUTROL 1 MMOL/ML IV SOLN
6.0000 mL | Freq: Once | INTRAVENOUS | Status: AC | PRN
  Administered 2020-08-28: 6 mL via INTRAVENOUS

## 2020-08-30 MED ORDER — AZITHROMYCIN 250 MG PO TABS: ORAL_TABLET | ORAL | 0 refills | Status: DC

## 2020-08-30 MED ORDER — PREDNISONE 10 MG PO TABS: ORAL_TABLET | ORAL | 0 refills | Status: DC

## 2020-08-30 NOTE — Telephone Encounter (Signed)
Z-Pak No. 1 take as directed Prednisone taper 10 mg #20, 4 tabs daily for 2 days, 3 tabs daily for 2 days, 2 tabs daily for 2 days, 1 tab daily for 2 days and stop Continue on Allegra and Flonase.  Add in saline nasal rinses  .Please contact office for sooner follow up if symptoms do not improve or worsen or seek emergency care

## 2020-08-30 NOTE — Telephone Encounter (Signed)
Received the following message from patient:   "Hi! Unfortunately I'm continuing to have pressure, drainage and a worsening sinus headache that is kicking me into migraines. You mentioned I could try a course of an antibiotic with a steroid dose. Could you send a prescription to [facility] [place] [facility] [place] ? I'd appreciate it. Have a great day"  I asked her if she had noticed any color to the drainage, this was her response.  "No change in drainage it is mainly waking me up at night draining in the back of my throat. I'm not getting a lot of mucus when I blow my nose. Mainly increased sinus irritation, pressure and headache"  TP, can you please advise? Thanks!

## 2020-09-19 MED ORDER — BUDESONIDE-FORMOTEROL FUMARATE 80-4.5 MCG/ACT IN AERO: 2.0000 | INHALATION_SPRAY | Freq: Two times a day (BID) | RESPIRATORY_TRACT | 5 refills | Status: DC

## 2020-10-01 ENCOUNTER — Other Ambulatory Visit (HOSPITAL_COMMUNITY)
Admission: RE | Admit: 2020-10-01 | Discharge: 2020-10-01 | Disposition: A | Discharge status: Home / Self Care | Payer: BC Managed Care – PPO | Source: Ambulatory Visit | Attending: Adult Health | Admitting: Adult Health

## 2020-10-01 DIAGNOSIS — Z01812 Encounter for preprocedural laboratory examination: Secondary | ICD-10-CM | POA: Insufficient documentation

## 2020-10-01 DIAGNOSIS — Z20822 Contact with and (suspected) exposure to covid-19: Secondary | ICD-10-CM | POA: Insufficient documentation

## 2020-10-01 LAB — SARS CORONAVIRUS 2 (TAT 6-24 HRS): SARS Coronavirus 2: NEGATIVE

## 2020-10-04 ENCOUNTER — Encounter: Payer: Self-pay | Admitting: Primary Care

## 2020-10-04 ENCOUNTER — Other Ambulatory Visit: Payer: Self-pay

## 2020-10-04 ENCOUNTER — Ambulatory Visit (INDEPENDENT_AMBULATORY_CARE_PROVIDER_SITE_OTHER): Payer: BC Managed Care – PPO | Admitting: Pulmonary Disease

## 2020-10-04 ENCOUNTER — Ambulatory Visit: Payer: BC Managed Care – PPO | Admitting: Primary Care

## 2020-10-04 ENCOUNTER — Ambulatory Visit: Payer: BC Managed Care – PPO | Admitting: Adult Health

## 2020-10-04 VITALS — BP 118/62 | HR 60 | Temp 97.5°F | Ht 65.0 in | Wt 146.0 lb

## 2020-10-04 DIAGNOSIS — J4531 Mild persistent asthma with (acute) exacerbation: Secondary | ICD-10-CM

## 2020-10-04 DIAGNOSIS — J302 Other seasonal allergic rhinitis: Secondary | ICD-10-CM | POA: Diagnosis not present

## 2020-10-04 DIAGNOSIS — R059 Cough, unspecified: Secondary | ICD-10-CM

## 2020-10-04 LAB — PULMONARY FUNCTION TEST
DL/VA % pred: 94 %
DL/VA: 4.03 ml/min/mmHg/L
DLCO cor % pred: 67 %
DLCO cor: 14.78 ml/min/mmHg
DLCO unc % pred: 67 %
DLCO unc: 14.78 ml/min/mmHg
FEF 25-75 Post: 3.88 L/sec
FEF 25-75 Pre: 2.82 L/sec
FEF2575-%Change-Post: 37 %
FEF2575-%Pred-Post: 137 %
FEF2575-%Pred-Pre: 100 %
FEV1-%Change-Post: 11 %
FEV1-%Pred-Post: 108 %
FEV1-%Pred-Pre: 96 %
FEV1-Post: 3.14 L
FEV1-Pre: 2.8 L
FEV1FVC-%Change-Post: 3 %
FEV1FVC-%Pred-Pre: 99 %
FEV6-%Change-Post: 7 %
FEV6-%Pred-Post: 106 %
FEV6-%Pred-Pre: 98 %
FEV6-Post: 3.79 L
FEV6-Pre: 3.52 L
FEV6FVC-%Pred-Post: 102 %
FEV6FVC-%Pred-Pre: 102 %
FVC-%Change-Post: 7 %
FVC-%Pred-Post: 103 %
FVC-%Pred-Pre: 96 %
FVC-Post: 3.79 L
FVC-Pre: 3.52 L
Post FEV1/FVC ratio: 83 %
Post FEV6/FVC ratio: 100 %
Pre FEV1/FVC ratio: 80 %
Pre FEV6/FVC Ratio: 100 %
RV % pred: 58 %
RV: 1.08 L
TLC % pred: 113 %
TLC: 5.92 L

## 2020-10-04 LAB — NITRIC OXIDE: Nitric Oxide: 12

## 2020-10-04 MED ORDER — AZELASTINE HCL 0.1 % NA SOLN: 1.0000 | Freq: Two times a day (BID) | NASAL | 5 refills | Status: DC

## 2020-10-04 NOTE — Progress Notes (Signed)
Full PFT performed today. °

## 2020-10-04 NOTE — Progress Notes (Signed)
@Patient  ID: Joanne Gavel, female    DOB: 06-24-1970, 50 y.o.   MRN: [id number]  Chief Complaint  Patient presents with   Follow-up    Had PFT today-sob with exertion, cough-dry, pnd     Referring provider: Greig Right, MD  HPI: 50 year old female, never smoked.  Past medical history significant for asthma, GERD, seasonal allergies. Patient of Dr. Ander Slade, last seen by pulmonary nurse practitioner on 08/23/2020.  10/04/2020 Patient presents today for 4 to 6-week follow-up PFTs. Breathing is better with low dose ICS/LABA. She is complaint with Symbicort 80mg  two puffs twice a day. She has not required her SABA, rarely needs to use it at night. Her symptoms tend to flare in Spring. She is taking Administrator, arts.    Pulmonary testing: 10/04/20 PFTs- FVC 3.79 (1035), FEV1 3.14 (108%), TLC 113%, ratio 83, +11% change in FEV1 p BD   10/04/2020 FENO - 12    Allergies  Allergen Reactions   Aleve [Naproxen Sodium] Anaphylaxis    Immunization History  Administered Date(s) Administered   Influenza,inj,quad, With Preservative 02/02/2018   Influenza-Unspecified 01/25/2020   Janssen (J&J) SARS-COV-2 Vaccination 02/15/2020    Past Medical History:  Diagnosis Date   Breast mass, right    Breast mass, right    Headache     Tobacco History: Social History   Tobacco Use  Smoking Status Never  Smokeless Tobacco Never   Counseling given: Not Answered   Outpatient Medications Prior to Visit  Medication Sig Dispense Refill   albuterol (VENTOLIN HFA) 108 (90 Base) MCG/ACT inhaler Inhale into the lungs every 6 (six) hours as needed for wheezing or shortness of breath.     budesonide-formoterol (SYMBICORT) 80-4.5 MCG/ACT inhaler Inhale 2 puffs into the lungs 2 (two) times daily. 1 each 5   Cholecalciferol 100 MCG (4000 UT) CAPS Take by mouth.     estradiol (ESTRACE) 0.1 MG/GM vaginal cream Place vaginally.     fexofenadine (ALLEGRA) 180 MG tablet Take 180 mg by  mouth daily.     Fluticasone Propionate (FLONASE NA) Place into the nose.     Multiple Vitamin (MULTIVITAMIN) capsule Take 1 capsule by mouth daily.     naratriptan (AMERGE) 2.5 MG tablet Take 2.5 mg by mouth as needed for migraine. Take one (1) tablet at onset of headache; if returns or does not resolve, may repeat after 4 hours; do not exceed five (5) mg in 24 hours.     azithromycin (ZITHROMAX) 250 MG tablet Take 2 tablets on first day, then 1 tablet daily until finished. (Patient not taking: Reported on 10/04/2020) 6 tablet 0   predniSONE (DELTASONE) 10 MG tablet Take 4 tabs for 2 days, then 3 tabs for 2 days, 2 tabs for 2 days, then 1 tab for 2 days, then stop. (Patient not taking: Reported on 10/04/2020) 20 tablet 0   No facility-administered medications prior to visit.    Review of Systems  Review of Systems  Constitutional: Negative.   HENT:  Positive for postnasal drip.   Respiratory:  Positive for cough. Negative for chest tightness, shortness of breath and wheezing.        Dyspnea on exertion-improved  Cardiovascular: Negative.     Physical Exam  BP 118/62 (BP Location: Right Arm, Cuff Size: Normal)   Pulse 60   Temp (!) 97.5 F (36.4 C) (Temporal)   Ht 5\' 5"  (1.651 m)   Wt 146 lb (66.2 kg)   SpO2 100%   BMI 24.30 kg/m  Physical Exam Constitutional:      Appearance: Normal appearance.  HENT:     Head: Normocephalic and atraumatic.  Cardiovascular:     Rate and Rhythm: Normal rate and regular rhythm.  Pulmonary:     Effort: Pulmonary effort is normal.     Breath sounds: Normal breath sounds. No wheezing, rhonchi or rales.  Neurological:     General: No focal deficit present.     Mental Status: She is alert and oriented to person, place, and time. Mental status is at baseline.  Psychiatric:        Mood and Affect: Mood normal.        Behavior: Behavior normal.        Thought Content: Thought content normal.     Lab Results:  CBC    Component Value Date/Time    WBC 6.6 08/23/2020 1214   RBC 4.41 08/23/2020 1214   HGB 13.6 08/23/2020 1214   HCT 40.5 08/23/2020 1214   PLT 227.0 08/23/2020 1214   MCV 91.8 08/23/2020 1214   MCHC 33.6 08/23/2020 1214   RDW 13.5 08/23/2020 1214   LYMPHSABS 2.1 08/23/2020 1214   MONOABS 0.5 08/23/2020 1214   EOSABS 0.1 08/23/2020 1214   BASOSABS 0.0 08/23/2020 1214    BMET No results found for: NA, K, CL, CO2, GLUCOSE, BUN, CREATININE, CALCIUM, GFRNONAA, GFRAA  BNP No results found for: BNP  ProBNP No results found for: PROBNP  Imaging: No results found.   Assessment & Plan:   No problem-specific Assessment & Plan notes found for this encounter.     Martyn Ehrich, NP 10/04/2020

## 2020-10-04 NOTE — Patient Instructions (Signed)
Full PFT performed today. °

## 2020-10-04 NOTE — Patient Instructions (Addendum)
  Recommendations: - Symbicort 64mcg two puffs morning and evening  - Start Astelin nasal spray- 1 spray per nostril twice daily - Continue Flonase nasal spray once daily  - Try changing antihistamine to either Zyrtec or Xyzal daily  - You can take Chlorpheniramine 4mg  every 4 hours as needed for cough or allergy symptoms (may cause drowsiness, use caution) - If symptoms do not improve would consider adding Singulair (montelukast) and/or referral to ENT   Follow-up: 6 months with Dr. Ander Slade or APP

## 2020-10-08 NOTE — Assessment & Plan Note (Signed)
-   PFTs showed normal spirometry, border line BD response. FENO 12.  - Continue Symbicort 77mcg two puffs morning and evening  - Start Astelin nasal spray- 1 spray per nostril twice daily - Continue Flonase nasal spray once daily  - Try changing antihistamine to either Zyrtec or Xyzal daily  - You can take Chlorpheniramine 4mg  every 4 hours as needed for cough or allergy symptoms (may cause drowsiness, use caution) - If symptoms do not improve would consider adding Singulair (montelukast) and/or referral to ENT   Follow-up: 6 months with Dr. Ander Slade or APP

## 2020-10-11 MED ORDER — BUDESONIDE-FORMOTEROL FUMARATE 160-4.5 MCG/ACT IN AERO: 2.0000 | INHALATION_SPRAY | Freq: Two times a day (BID) | RESPIRATORY_TRACT | 6 refills | Status: DC

## 2020-10-11 NOTE — Telephone Encounter (Signed)
Please send in Symb 160 two puffs twice daily. Needs first available visit with Dr. Ander Slade to re-assess

## 2020-10-11 NOTE — Telephone Encounter (Signed)
Received the following message from patient:  "My shortness of breath has worsened over the past week. I'm needing my albuterol 3-4 times daily(2puffs each time) and I've awakened with shortness of breath two nights this week  needing albuterol. I'm having episodes of shortness of breath and I'm exhausted without exertion during the day.  I'm needing to lye down and rest throughout the day to get minimal household tasks accomplished. I do not have any cold or virus symptoms and I'm staying indoors out of this heat. Could you advise me on how to improve my breathing? Can I increase the Symbicort dose? Or what do you advise?   I'm currently taking symbicort 80/4.5 2p twice daily, Astelin 1 spray each nostril twice daily,  xyzal daily, Flonase daily. I appreciate your help. Zacarias Pontes"  Patient was last seen by [facility] on 10/01/20. Beth, can you please advise? Thanks!

## 2020-10-22 MED ORDER — ALBUTEROL SULFATE HFA 108 (90 BASE) MCG/ACT IN AERS: 2.0000 | INHALATION_SPRAY | Freq: Four times a day (QID) | RESPIRATORY_TRACT | 2 refills | Status: DC | PRN

## 2020-10-24 DIAGNOSIS — R945 Abnormal results of liver function studies: Secondary | ICD-10-CM | POA: Diagnosis not present

## 2020-10-24 DIAGNOSIS — R5381 Other malaise: Secondary | ICD-10-CM | POA: Diagnosis not present

## 2020-10-24 DIAGNOSIS — J069 Acute upper respiratory infection, unspecified: Secondary | ICD-10-CM | POA: Diagnosis not present

## 2020-10-24 MED ORDER — AMOXICILLIN-POT CLAVULANATE 875-125 MG PO TABS: 1.0000 | ORAL_TABLET | Freq: Two times a day (BID) | ORAL | 0 refills | Status: DC

## 2020-10-24 NOTE — Telephone Encounter (Signed)
Please send in Augmentin 1 tab twice daily x 7 days for sinusitis

## 2020-10-24 NOTE — Telephone Encounter (Signed)
Brooke Haas, please see mychart message sent by pt and advise: Brooke Corporal Cranford "Lora"  Brooke Ehrich, NP 46 minutes ago (12:14 PM)   I think I've developed another sinus infection. I am having drainage, pressure behind my eyes and sinuses, ear pain, post nasal drip and sinus headaches. I feel awful.   My shortness of breath has improved slightly, but is persisting.  I'm able to do light house work,  work at [employer] and I do not wake up needing albuterol at night. However, I'm no where close to my usual self. I'm continuing to be so fatigued and drowsy during the day. I'm still needing my albuterol 3 times daily(2puffs each time).   My Symbicort was increased to 160/4.5 2p twice daily on 6/17. I continue Astelin 1 spray each nostril twice daily,  Flonase daily, I changed back to Allegra daily to see if the zyxal was causing my drowsiness, but haven't noticed a change. What do you suggest? I appreciate your help.

## 2020-10-29 DIAGNOSIS — W228XXA Striking against or struck by other objects, initial encounter: Secondary | ICD-10-CM | POA: Diagnosis not present

## 2020-10-29 DIAGNOSIS — S060X9A Concussion with loss of consciousness of unspecified duration, initial encounter: Secondary | ICD-10-CM | POA: Diagnosis not present

## 2020-10-29 DIAGNOSIS — R109 Unspecified abdominal pain: Secondary | ICD-10-CM | POA: Diagnosis not present

## 2020-10-29 DIAGNOSIS — S0990XA Unspecified injury of head, initial encounter: Secondary | ICD-10-CM | POA: Diagnosis not present

## 2020-10-29 DIAGNOSIS — R110 Nausea: Secondary | ICD-10-CM | POA: Diagnosis not present

## 2020-10-29 DIAGNOSIS — Z7951 Long term (current) use of inhaled steroids: Secondary | ICD-10-CM | POA: Diagnosis not present

## 2020-10-29 DIAGNOSIS — J45909 Unspecified asthma, uncomplicated: Secondary | ICD-10-CM | POA: Diagnosis not present

## 2020-10-29 DIAGNOSIS — R410 Disorientation, unspecified: Secondary | ICD-10-CM | POA: Diagnosis not present

## 2020-10-30 NOTE — Telephone Encounter (Signed)
Hello Beth, please see mychart message below, thanks!  I noticed the following on my pulmonary test from an update on 10/24/20. Could this account for my continued shortness of breath? "Conclusions: The diffusion defect is consistent with a pulmonary vascular process. Pulmonary Function Diagnosis: Mild Diffusion Defect -Pulmonary Vascular"

## 2020-10-31 NOTE — Telephone Encounter (Signed)
Her diffusion capacity mostly corrected when correlated with her lung volumes. This could be from slight scarring in the lung apices noted on CT imaging back in 2021. If she is still having shortness of breath recommend repeat CT chest wo contrast and following up with her normal provider here.

## 2020-11-16 MED ORDER — MONTELUKAST SODIUM 10 MG PO TABS: 10.0000 mg | ORAL_TABLET | Freq: Every day | ORAL | 1 refills | Status: DC

## 2020-11-16 NOTE — Telephone Encounter (Signed)
Annie Paras, can you please advise on mychart message below, thanks!    I would like to try singular '10mg'$  daily as mentioned in my last appointment, to see if it will help with my continued sinus issues. I saw my primary doctor 10/24/20 for another sinus infection. She prescribed augmentin '875mg'$  twice daily for 10days. About 8 days after I finished my antibiotics I began getting the same symptoms of increased drainage, sinus pressure, ear pain, headache, and malaise. I was able to schedule an ENT appt. for sept 2.  I thought if I tried singulair before the ent appointment perhaps at least we would know if it makes any difference in my symptoms. What are your thoughts? I appreciate your help.

## 2020-11-16 NOTE — Telephone Encounter (Signed)
Yes I am ok with this, can you please send in RX Montelukast (singulair) '10mg'$  at bedtime #30 with 1 refill

## 2020-11-19 ENCOUNTER — Other Ambulatory Visit: Payer: Self-pay | Admitting: Primary Care

## 2020-11-20 DIAGNOSIS — J069 Acute upper respiratory infection, unspecified: Secondary | ICD-10-CM | POA: Diagnosis not present

## 2020-12-10 ENCOUNTER — Other Ambulatory Visit: Payer: Self-pay | Admitting: Obstetrics and Gynecology

## 2020-12-10 DIAGNOSIS — Z1231 Encounter for screening mammogram for malignant neoplasm of breast: Secondary | ICD-10-CM

## 2020-12-11 DIAGNOSIS — M9905 Segmental and somatic dysfunction of pelvic region: Secondary | ICD-10-CM | POA: Diagnosis not present

## 2020-12-11 DIAGNOSIS — M5451 Vertebrogenic low back pain: Secondary | ICD-10-CM | POA: Diagnosis not present

## 2020-12-11 DIAGNOSIS — M9903 Segmental and somatic dysfunction of lumbar region: Secondary | ICD-10-CM | POA: Diagnosis not present

## 2020-12-11 DIAGNOSIS — M9902 Segmental and somatic dysfunction of thoracic region: Secondary | ICD-10-CM | POA: Diagnosis not present

## 2020-12-12 ENCOUNTER — Other Ambulatory Visit: Payer: Self-pay | Admitting: Primary Care

## 2020-12-13 ENCOUNTER — Ambulatory Visit
Admission: RE | Admit: 2020-12-13 | Discharge: 2020-12-13 | Disposition: A | Discharge status: Home / Self Care | Payer: BC Managed Care – PPO | Source: Ambulatory Visit | Attending: Obstetrics and Gynecology | Admitting: Obstetrics and Gynecology

## 2020-12-13 ENCOUNTER — Other Ambulatory Visit: Payer: Self-pay

## 2020-12-13 DIAGNOSIS — M9902 Segmental and somatic dysfunction of thoracic region: Secondary | ICD-10-CM | POA: Diagnosis not present

## 2020-12-13 DIAGNOSIS — M9903 Segmental and somatic dysfunction of lumbar region: Secondary | ICD-10-CM | POA: Diagnosis not present

## 2020-12-13 DIAGNOSIS — M9905 Segmental and somatic dysfunction of pelvic region: Secondary | ICD-10-CM | POA: Diagnosis not present

## 2020-12-13 DIAGNOSIS — Z1231 Encounter for screening mammogram for malignant neoplasm of breast: Secondary | ICD-10-CM

## 2020-12-13 DIAGNOSIS — M5451 Vertebrogenic low back pain: Secondary | ICD-10-CM | POA: Diagnosis not present

## 2020-12-17 DIAGNOSIS — M9905 Segmental and somatic dysfunction of pelvic region: Secondary | ICD-10-CM | POA: Diagnosis not present

## 2020-12-17 DIAGNOSIS — M9902 Segmental and somatic dysfunction of thoracic region: Secondary | ICD-10-CM | POA: Diagnosis not present

## 2020-12-17 DIAGNOSIS — M9903 Segmental and somatic dysfunction of lumbar region: Secondary | ICD-10-CM | POA: Diagnosis not present

## 2020-12-17 DIAGNOSIS — M5451 Vertebrogenic low back pain: Secondary | ICD-10-CM | POA: Diagnosis not present

## 2020-12-21 DIAGNOSIS — R051 Acute cough: Secondary | ICD-10-CM | POA: Diagnosis not present

## 2020-12-21 DIAGNOSIS — R0981 Nasal congestion: Secondary | ICD-10-CM | POA: Diagnosis not present

## 2020-12-21 DIAGNOSIS — J0110 Acute frontal sinusitis, unspecified: Secondary | ICD-10-CM | POA: Diagnosis not present

## 2020-12-21 DIAGNOSIS — J0100 Acute maxillary sinusitis, unspecified: Secondary | ICD-10-CM | POA: Diagnosis not present

## 2020-12-28 DIAGNOSIS — J343 Hypertrophy of nasal turbinates: Secondary | ICD-10-CM | POA: Diagnosis not present

## 2020-12-28 DIAGNOSIS — R0981 Nasal congestion: Secondary | ICD-10-CM | POA: Diagnosis not present

## 2020-12-28 DIAGNOSIS — J342 Deviated nasal septum: Secondary | ICD-10-CM | POA: Diagnosis not present

## 2020-12-28 DIAGNOSIS — R519 Headache, unspecified: Secondary | ICD-10-CM | POA: Diagnosis not present

## 2021-01-02 DIAGNOSIS — J310 Chronic rhinitis: Secondary | ICD-10-CM | POA: Diagnosis not present

## 2021-01-02 DIAGNOSIS — J329 Chronic sinusitis, unspecified: Secondary | ICD-10-CM | POA: Diagnosis not present

## 2021-01-07 DIAGNOSIS — Z13220 Encounter for screening for lipoid disorders: Secondary | ICD-10-CM | POA: Diagnosis not present

## 2021-01-07 DIAGNOSIS — Z1329 Encounter for screening for other suspected endocrine disorder: Secondary | ICD-10-CM | POA: Diagnosis not present

## 2021-01-07 DIAGNOSIS — Z01419 Encounter for gynecological examination (general) (routine) without abnormal findings: Secondary | ICD-10-CM | POA: Diagnosis not present

## 2021-01-07 DIAGNOSIS — Z1321 Encounter for screening for nutritional disorder: Secondary | ICD-10-CM | POA: Diagnosis not present

## 2021-01-10 DIAGNOSIS — R0981 Nasal congestion: Secondary | ICD-10-CM | POA: Diagnosis not present

## 2021-01-10 DIAGNOSIS — E7800 Pure hypercholesterolemia, unspecified: Secondary | ICD-10-CM | POA: Diagnosis not present

## 2021-01-10 DIAGNOSIS — G43009 Migraine without aura, not intractable, without status migrainosus: Secondary | ICD-10-CM | POA: Diagnosis not present

## 2021-01-10 DIAGNOSIS — J343 Hypertrophy of nasal turbinates: Secondary | ICD-10-CM | POA: Diagnosis not present

## 2021-01-10 DIAGNOSIS — J342 Deviated nasal septum: Secondary | ICD-10-CM | POA: Diagnosis not present

## 2021-01-10 DIAGNOSIS — J329 Chronic sinusitis, unspecified: Secondary | ICD-10-CM | POA: Diagnosis not present

## 2021-02-13 DIAGNOSIS — R0981 Nasal congestion: Secondary | ICD-10-CM | POA: Diagnosis not present

## 2021-02-13 DIAGNOSIS — J45909 Unspecified asthma, uncomplicated: Secondary | ICD-10-CM | POA: Diagnosis not present

## 2021-02-13 DIAGNOSIS — J342 Deviated nasal septum: Secondary | ICD-10-CM | POA: Diagnosis not present

## 2021-02-13 DIAGNOSIS — J343 Hypertrophy of nasal turbinates: Secondary | ICD-10-CM | POA: Diagnosis not present

## 2021-02-13 DIAGNOSIS — J3489 Other specified disorders of nose and nasal sinuses: Secondary | ICD-10-CM | POA: Diagnosis not present

## 2021-02-14 DIAGNOSIS — Z09 Encounter for follow-up examination after completed treatment for conditions other than malignant neoplasm: Secondary | ICD-10-CM | POA: Diagnosis not present

## 2021-02-18 DIAGNOSIS — Z09 Encounter for follow-up examination after completed treatment for conditions other than malignant neoplasm: Secondary | ICD-10-CM | POA: Diagnosis not present

## 2021-02-25 DIAGNOSIS — Z09 Encounter for follow-up examination after completed treatment for conditions other than malignant neoplasm: Secondary | ICD-10-CM | POA: Diagnosis not present

## 2021-03-04 DIAGNOSIS — Z09 Encounter for follow-up examination after completed treatment for conditions other than malignant neoplasm: Secondary | ICD-10-CM | POA: Diagnosis not present

## 2021-03-06 ENCOUNTER — Encounter: Payer: Self-pay | Admitting: Allergy and Immunology

## 2021-03-06 ENCOUNTER — Other Ambulatory Visit: Payer: Self-pay

## 2021-03-06 ENCOUNTER — Ambulatory Visit: Payer: BC Managed Care – PPO | Admitting: Allergy and Immunology

## 2021-03-06 VITALS — BP 118/74 | HR 88 | Resp 16 | Ht 65.0 in | Wt 151.0 lb

## 2021-03-06 DIAGNOSIS — K219 Gastro-esophageal reflux disease without esophagitis: Secondary | ICD-10-CM

## 2021-03-06 DIAGNOSIS — J4540 Moderate persistent asthma, uncomplicated: Secondary | ICD-10-CM

## 2021-03-06 DIAGNOSIS — J3089 Other allergic rhinitis: Secondary | ICD-10-CM | POA: Diagnosis not present

## 2021-03-06 MED ORDER — FAMOTIDINE 40 MG PO TABS: 40.0000 mg | ORAL_TABLET | Freq: Every evening | ORAL | 5 refills | Status: DC

## 2021-03-06 MED ORDER — OMEPRAZOLE 40 MG PO CPDR: 40.0000 mg | DELAYED_RELEASE_CAPSULE | Freq: Every day | ORAL | 5 refills | Status: DC

## 2021-03-06 NOTE — Progress Notes (Signed)
[place] - [place] - [place] - [place] - [place]   Dear Dr. Burnett Sheng,  Thank you for referring Brooke Haas to the [facility] on 03/06/2021.   Below is a summation of this patient's evaluation and recommendations.  Thank you for your referral. I will keep you informed about this patient's response to treatment.   If you have any questions please do not hesitate to contact me.   Sincerely,  Jiles Prows, MD Allergy / Immunology [facility]   ______________________________________________________________________    NEW PATIENT NOTE  Referring Provider: Greig Right, MD Primary Provider: Greig Right, MD Date of office visit: 03/06/2021    Subjective:   Chief Complaint:  Brooke Haas (DOB: [birth year]) is a 50 y.o. female who presents to the clinic on 03/06/2021 with a chief complaint of Asthma and Allergic Rhinitis  .     HPI: Zacarias Pontes presents to this clinic in evaluation of persistent respiratory tract symptoms.  For few years she has been having problems with "allergies", "sinus", and "asthma".  Her respiratory tract symptoms appear to exacerbate after contracting COVID about 2 years ago.  She has been evaluated by pulmonology and ENT.  Her symptoms include runny nose, clogged up head, decreased ability to smell, itchy eyes, postnasal drip, throat clearing, hacking cough, tickle in her throat, raspy voice, ear clogging without hearing loss or tinnitus or vertigo, cough, occasional chest tightness.  This appears to be a perennial problem and maybe is a little more prevalent during pollination season.  She has been treated with Symbicort 160 which does help her symptoms somewhat.  If she misses a dose of Symbicort 160 in the evening then she wakes up at nighttime and must use a short acting bronchodilator.  She does not have any cold air induced  bronchospastic symptoms and she does not have any exercise-induced bronchospastic symptoms.  She recently underwent a septoplasty and turbinate reduction with Dr. Gaylyn Cheers, ENT, in October 2022 which opened up her airways and she can smell pretty good at this point.  Apparently she had a preoperative CT scan of her sinuses which did not identify any sinus disease.  She has reflux up into her high chest with burning.  She will occasionally use some over-the-counter Prilosec or Tums or Pepcid.  She does drink 1 coffee per day, has tea and soda twice a week, and has chocolate multiple times per week, and rarely has any alcohol.  She has migraine headaches presenting themselves 1 or 2 times per month that responds appropriately to [place].  She has received 3 COVID vaccinations and as noted above contracted COVID approximately 2 years ago and has received the flu vaccine this year.  Past Medical History:  Diagnosis Date   Asthma    Breast mass, right    Breast mass, right    Headache     Past Surgical History:  Procedure Laterality Date   ABDOMINAL HYSTERECTOMY     BREAST BIOPSY Left    BREAST EXCISIONAL BIOPSY Right    CESAREAN SECTION     CHOLECYSTECTOMY     RADIOACTIVE SEED GUIDED EXCISIONAL BREAST BIOPSY Right 12/01/2016   Procedure: RIGHT RADIOACTIVE SEED GUIDED EXCISIONAL BREAST BIOPSY;  Surgeon: Rolm Bookbinder, MD;  Location: [facility];  Service: General;  Laterality: Right;   ROTATOR CUFF REPAIR Right    SINOSCOPY     TONSILLECTOMY      Allergies  as of 03/06/2021       Reactions   Aleve [naproxen Sodium] Anaphylaxis      Medication List    albuterol 108 (90 Base) MCG/ACT inhaler Commonly known as: VENTOLIN HFA Inhale 2 puffs into the lungs every 6 (six) hours as needed for wheezing or shortness of breath.   budesonide-formoterol 160-4.5 MCG/ACT inhaler Commonly known as: SYMBICORT Inhale 2 puffs into the lungs 2 (two) times daily.   Cholecalciferol  100 MCG (4000 UT) Caps Take by mouth.   estradiol 0.1 MG/GM vaginal cream Commonly known as: ESTRACE Place vaginally.   fexofenadine 180 MG tablet Commonly known as: ALLEGRA Take 180 mg by mouth daily.   FLONASE NA Place into the nose.   montelukast 10 MG tablet Commonly known as: SINGULAIR TAKE 1 TABLET BY MOUTH AT BEDTIME   multivitamin capsule Take 1 capsule by mouth daily.   naratriptan 2.5 MG tablet Commonly known as: AMERGE Take 2.5 mg by mouth as needed for migraine. Take one (1) tablet at onset of headache; if returns or does not resolve, may repeat after 4 hours; do not exceed five (5) mg in 24 hours.        Review of systems negative except as noted in HPI / PMHx or noted below:  Review of Systems  Constitutional: Negative.   HENT: Negative.    Eyes: Negative.   Respiratory: Negative.    Cardiovascular: Negative.   Gastrointestinal: Negative.   Genitourinary: Negative.   Musculoskeletal: Negative.   Skin: Negative.   Neurological: Negative.   Endo/Heme/Allergies: Negative.   Psychiatric/Behavioral: Negative.     Family History  Problem Relation Age of Onset   Allergic rhinitis Mother    Breast cancer Mother 54   Allergic rhinitis Father    Allergic rhinitis Sister    Asthma Sister     Social History   Socioeconomic History   Marital status: Married    Spouse name: Not on file   Number of children: Not on file   Years of education: Not on file   Highest education level: Not on file  Occupational History   Not on file  Tobacco Use   Smoking status: Never   Smokeless tobacco: Never  Substance and Sexual Activity   Alcohol use: Yes    Comment: social   Drug use: No   Sexual activity: Not on file  Other Topics Concern   Not on file  Social History Narrative   Not on file   Environmental and Social history  Lives in a house with a dry environment, dogs located inside the household, carpet in the bedroom, no plastic on the bed, no  plastic on the pillow, no smoking ongoing with inside the household.  She works as a Software engineer.  Objective:   Vitals:   03/06/21 0904  BP: 118/74  Pulse: 88  Resp: 16  SpO2: 98%   Height: 5\' 5"  (165.1 cm) Weight: 151 lb (68.5 kg)  Physical Exam Constitutional:      Appearance: She is not diaphoretic.  HENT:     Head: Normocephalic.     Right Ear: Tympanic membrane, ear canal and external ear normal.     Left Ear: Tympanic membrane, ear canal and external ear normal.     Nose: Nose normal. No mucosal edema or rhinorrhea.     Mouth/Throat:     Pharynx: Uvula midline. No oropharyngeal exudate.  Eyes:     Conjunctiva/sclera: Conjunctivae normal.  Neck:     Thyroid: No  thyromegaly.     Trachea: Trachea normal. No tracheal tenderness or tracheal deviation.  Cardiovascular:     Rate and Rhythm: Normal rate and regular rhythm.     Heart sounds: Normal heart sounds, S1 normal and S2 normal. No murmur heard. Pulmonary:     Effort: No respiratory distress.     Breath sounds: Normal breath sounds. No stridor. No wheezing or rales.  Lymphadenopathy:     Head:     Right side of head: No tonsillar adenopathy.     Left side of head: No tonsillar adenopathy.     Cervical: No cervical adenopathy.  Skin:    Findings: No erythema or rash.     Nails: There is no clubbing.  Neurological:     Mental Status: She is alert.    Diagnostics: Allergy skin tests were performed.  She did not demonstrate any hypersensitivity against a screening panel of aeroallergens.  Spirometry was performed and demonstrated an FEV1 of 3.12 @ 110 % of predicted. FEV1/FVC = 0.80  The patient had an Asthma Control Test with the following results:  .    Results of a chest CT angio obtained 21 June 2019 identified the following:  Cardiovascular: There is no demonstrable pulmonary embolus. There is no appreciable thoracic aortic aneurysm or dissection. The visualized great vessels appear normal. There is  no pericardial effusion or pericardial thickening.   Mediastinum/Nodes: Visualized thyroid appears normal. There is no evident thoracic adenopathy. There are no appreciable esophageal lesions.   Lungs/Pleura: There is slight scarring in the extreme lung apices. There is slight atelectatic change in the posterior left base. There is no edema or airspace opacity. There is a stable nodular opacity in the anterior segment of the left lower lobe measuring 3 mm. No evident pleural effusions.  Results of pulmonary function tests obtained 05 October 2019 identified TLC 113% predicted, RV 58% predicted, DL/VA 94%.  Results of blood tests obtained 23 August 2020 identified WBC 6.6, absolute eosinophil 100, absolute lymphocyte 2100, hemoglobin 13.6, platelet 227, IgE 23 KU/L, no antigen specific IgE antibodies on an aero allergen panel.  Results of a sinus CT scan obtained 01 January 2021 identified mild ethmoid and maxillary sinus mucosal thickening, occluded left ostiomeatal units.  Assessment and Plan:    1. Asthma, moderate persistent, well-controlled   2. Perennial allergic rhinitis   3. LPRD (laryngopharyngeal reflux disease)     1.  Allergen avoidance measures?  2.  Treat and prevent inflammation:  A.  Symbicort 160 - 2 inhalations 2 times a day w/spacer (empty lungs) B.  Flonase Sensimist or Nasacort - 1-2 sprays each nostril 1 time per day C.  Attempt to discontinue montelukast   3.  Treat and prevent reflux/LPR:  A.  Slowly consolidate caffeine and chocolate consumption B.  Replace throat clearing with swallowing / drinking maneuver C.  Omeprazole 40 mg - 1 tablet 1 time per day in a.m. D.  Famotidine 40 mg - 1 tablet 1 time per day in p.m.  4.  If needed:  A. Albuterol HFA - 2 inhalations every 4-6 hours B. Cetirizine 10 mg - 1 tablet 1-2 times per day  5. Return to clinic in 4 weeks or earlier if problem  Mickel Baas has an inflamed and irritated respiratory track.  We could  not identify a specific atopic trigger during today's evaluation.  She does appear to have a reflux trigger.  We will treat her with a combination of anti-inflammatory agents for her airway  and aggressive therapy directed against reflux as noted above.  Montelukast has never really helped her very much and she can attempt to discontinue this agent.  I will see her back in this clinic in 4 weeks or earlier if there is a problem.  Jiles Prows, MD Allergy / Immunology [facility]

## 2021-03-06 NOTE — Patient Instructions (Addendum)
  1.  Allergen avoidance measures?  2.  Treat and prevent inflammation:  A.  Symbicort 160 - 2 inhalations 2 times a day w/spacer (empty lungs) B.  Flonase Sensimist or Nasacort - 1-2 sprays each nostril 1 time per day C.  Attempt to discontinue montelukast   3.  Treat and prevent reflux/LPR:  A.  Slowly consolidate caffeine and chocolate consumption B.  Replace throat clearing with swallowing / drinking maneuver C.  Omeprazole 40 mg - 1 tablet 1 time per day in a.m. D.  Famotidine 40 mg - 1 tablet 1 time per day in p.m.  4.  If needed:  A. Albuterol HFA - 2 inhalations every 4-6 hours B. Cetirizine 10 mg - 1 tablet 1-2 times per day  5. Return to clinic in 4 weeks or earlier if problem

## 2021-03-07 ENCOUNTER — Encounter: Payer: Self-pay | Admitting: Allergy and Immunology

## 2021-03-12 ENCOUNTER — Encounter: Payer: Self-pay | Admitting: Gastroenterology

## 2021-03-15 ENCOUNTER — Encounter: Payer: Self-pay | Admitting: *Deleted

## 2021-03-20 ENCOUNTER — Encounter: Payer: Self-pay | Admitting: Primary Care

## 2021-03-22 ENCOUNTER — Other Ambulatory Visit: Payer: Self-pay | Admitting: Primary Care

## 2021-03-26 DIAGNOSIS — M25511 Pain in right shoulder: Secondary | ICD-10-CM | POA: Diagnosis not present

## 2021-03-26 DIAGNOSIS — G8929 Other chronic pain: Secondary | ICD-10-CM | POA: Diagnosis not present

## 2021-04-01 ENCOUNTER — Other Ambulatory Visit: Payer: Self-pay | Admitting: Pulmonary Disease

## 2021-04-03 ENCOUNTER — Encounter: Payer: Self-pay | Admitting: Allergy and Immunology

## 2021-04-03 ENCOUNTER — Ambulatory Visit: Payer: BC Managed Care – PPO | Admitting: Allergy and Immunology

## 2021-04-03 ENCOUNTER — Other Ambulatory Visit: Payer: Self-pay

## 2021-04-03 VITALS — BP 108/66 | HR 64 | Resp 16

## 2021-04-03 DIAGNOSIS — J3089 Other allergic rhinitis: Secondary | ICD-10-CM | POA: Diagnosis not present

## 2021-04-03 DIAGNOSIS — K219 Gastro-esophageal reflux disease without esophagitis: Secondary | ICD-10-CM | POA: Diagnosis not present

## 2021-04-03 DIAGNOSIS — J4540 Moderate persistent asthma, uncomplicated: Secondary | ICD-10-CM | POA: Diagnosis not present

## 2021-04-03 NOTE — Patient Instructions (Signed)
  1.  Continue to treat and prevent inflammation:  A.  Symbicort 160 - 2 inhalations 2 times a day w/spacer (empty lungs) B.  Flonase Sensimist or Nasacort - 1-2 sprays each nostril 1 time per day  2.  Continue to treat and prevent reflux/LPR:  A.  Obtain upper endoscopy with Dr. Lyndel Safe.  Eosinophilic esophagitis??? B.  Replace throat clearing with swallowing / drinking maneuver C.  Omeprazole 40 mg - 1 tablet 1 time per day in a.m. D.  Famotidine 40 mg - 1 tablet 1 time per day in p.m. E.  Nissen fundoplication???  4.  If needed:  A. Albuterol HFA - 2 inhalations every 4-6 hours B. Cetirizine 10 mg - 1 tablet 1-2 times per day  5. Return to clinic in 8 weeks or earlier if problem

## 2021-04-03 NOTE — Progress Notes (Signed)
[place] - [place] - [place]   Follow-up Note  Referring Provider: Greig Right, MD Primary Provider: Greig Right, MD Date of Office Visit: 04/03/2021  Subjective:   Brooke Haas (DOB: 10/09/1970) is a 50 y.o. female who returns to the [facility] on 04/03/2021 in re-evaluation of the following:  HPI: Zacarias Pontes returns to this clinic in evaluation of a inflamed and irritated respiratory tract believed to be secondary to a combination of asthma / rhinitis and reflux induced respiratory disease.  I last saw her in this clinic during her initial evaluation of 06 March 2021.  Her upper airway issues doing much better and her runny nose and clogged up head and decreased ability to smell and itchy eyes are pretty much gone.  She still has a lot of postnasal drip and throat clearing and hacking cough and tickle in her throat and intermittent raspy voice but that may be slightly better.  On further discussion of her reflux she still has burning high up in her chest and she has to use some Tums in addition to her current therapy.  Sometimes she feels as though food gets stuck in her chest and it does not matter if she tries to chase it with water and her sensation still occurs within her chest even after drinking.  She has undergone an upper endoscopy several years ago with Dr. Lyndel Safe and she has an appointment to see him in December 2022.  Her last chest x-ray was during her COVID infection 05 October 2019 which did not identify any significant mediastinal abnormality  Allergies as of 04/03/2021       Reactions   Aleve [naproxen Sodium] Anaphylaxis        Medication List    albuterol 108 (90 Base) MCG/ACT inhaler Commonly known as: VENTOLIN HFA Inhale 2 puffs into the lungs every 6 (six) hours as needed for wheezing or shortness of breath.   budesonide-formoterol 160-4.5 MCG/ACT inhaler Commonly known as: SYMBICORT Inhale 2 puffs  into the lungs 2 (two) times daily.   Cholecalciferol 100 MCG (4000 UT) Caps Take by mouth.   estradiol 0.1 MG/GM vaginal cream Commonly known as: ESTRACE Place vaginally.   famotidine 40 MG tablet Commonly known as: PEPCID Take 1 tablet (40 mg total) by mouth every evening.   fexofenadine 180 MG tablet Commonly known as: ALLEGRA Take 180 mg by mouth daily.   FLONASE NA Place into the nose.   multivitamin capsule Take 1 capsule by mouth daily.   naratriptan 2.5 MG tablet Commonly known as: AMERGE Take 2.5 mg by mouth as needed for migraine. Take one (1) tablet at onset of headache; if returns or does not resolve, may repeat after 4 hours; do not exceed five (5) mg in 24 hours.   omeprazole 40 MG capsule Commonly known as: PRILOSEC Take 1 capsule (40 mg total) by mouth daily.    Past Medical History:  Diagnosis Date   Asthma    Breast mass, right    Breast mass, right    Headache     Past Surgical History:  Procedure Laterality Date   ABDOMINAL HYSTERECTOMY     BREAST BIOPSY Left    BREAST EXCISIONAL BIOPSY Right    CESAREAN SECTION     CHOLECYSTECTOMY     ESOPHAGOGASTRODUODENOSCOPY  12/11/2004   Mild gastritis. Otherwise normal EGD   RADIOACTIVE SEED GUIDED EXCISIONAL BREAST BIOPSY Right 12/01/2016   Procedure: RIGHT RADIOACTIVE SEED GUIDED EXCISIONAL BREAST  BIOPSY;  Surgeon: Rolm Bookbinder, MD;  Location: [facility];  Service: General;  Laterality: Right;   ROTATOR CUFF REPAIR Right    SINOSCOPY     TONSILLECTOMY      Review of systems negative except as noted in HPI / PMHx or noted below:  Review of Systems  Constitutional: Negative.   HENT: Negative.    Eyes: Negative.   Respiratory: Negative.    Cardiovascular: Negative.   Gastrointestinal: Negative.   Genitourinary: Negative.   Musculoskeletal: Negative.   Skin: Negative.   Neurological: Negative.   Endo/Heme/Allergies: Negative.   Psychiatric/Behavioral: Negative.       Objective:   Vitals:   04/03/21 1058  BP: 108/66  Pulse: 64  Resp: 16  SpO2: 98%          Physical Exam Constitutional:      Appearance: She is not diaphoretic.  HENT:     Head: Normocephalic.     Right Ear: Tympanic membrane, ear canal and external ear normal.     Left Ear: Tympanic membrane, ear canal and external ear normal.     Nose: Nose normal. No mucosal edema or rhinorrhea.     Mouth/Throat:     Pharynx: Uvula midline. No oropharyngeal exudate.  Eyes:     Conjunctiva/sclera: Conjunctivae normal.  Neck:     Thyroid: No thyromegaly.     Trachea: Trachea normal. No tracheal tenderness or tracheal deviation.  Cardiovascular:     Rate and Rhythm: Normal rate and regular rhythm.     Heart sounds: Normal heart sounds, S1 normal and S2 normal. No murmur heard. Pulmonary:     Effort: No respiratory distress.     Breath sounds: Normal breath sounds. No stridor. No wheezing or rales.  Lymphadenopathy:     Head:     Right side of head: No tonsillar adenopathy.     Left side of head: No tonsillar adenopathy.     Cervical: No cervical adenopathy.  Skin:    Findings: No erythema or rash.     Nails: There is no clubbing.  Neurological:     Mental Status: She is alert.    Diagnostics:    Spirometry was performed and demonstrated an FEV1 of 2.79 at 98 % of predicted.  Assessment and Plan:   1. Asthma, moderate persistent, well-controlled   2. Perennial allergic rhinitis   3. LPRD (laryngopharyngeal reflux disease)     1.  Continue to treat and prevent inflammation:  A.  Symbicort 160 - 2 inhalations 2 times a day w/spacer (empty lungs) B.  Flonase Sensimist or Nasacort - 1-2 sprays each nostril 1 time per day  2.  Continue to treat and prevent reflux/LPR:  A.  Obtain upper endoscopy with Dr. Lyndel Safe.  Eosinophilic esophagitis??? B.  Replace throat clearing with swallowing / drinking maneuver C.  Omeprazole 40 mg - 1 tablet 1 time per day in a.m. D.   Famotidine 40 mg - 1 tablet 1 time per day in p.m. E.  Nissen fundoplication???  4.  If needed:  A. Albuterol HFA - 2 inhalations every 4-6 hours B. Cetirizine 10 mg - 1 tablet 1-2 times per day  5. Return to clinic in 8 weeks or earlier if problem  I am going to keep [place] on a collection of anti-inflammatory therapy for her airway and aggressive therapy directed against LPR for a full 12 weeks.  She has already completed 4 weeks of this therapy and thus I will see  her back in this clinic in 8 weeks.  There may be an opportunity to consolidate some of her medical treatment at that point.  She has very bad reflux and continues to be evaluated for possible eosinophilic esophagitis especially given the fact that she does have a history suggesting some esophageal dysmotility and she also needs to consider possibly undergoing a Nissen fundoplication as she has not responding to pretty aggressive therapy directed against her reflux.  I have given her the name of that procedure so she can explore that option in more detail.  Allena Katz, MD Allergy / Immunology [place]

## 2021-04-04 ENCOUNTER — Encounter: Payer: Self-pay | Admitting: Allergy and Immunology

## 2021-04-05 ENCOUNTER — Ambulatory Visit: Payer: BC Managed Care – PPO | Admitting: Primary Care

## 2021-04-09 ENCOUNTER — Ambulatory Visit: Payer: BC Managed Care – PPO | Admitting: Gastroenterology

## 2021-04-25 ENCOUNTER — Encounter: Payer: Self-pay | Admitting: Gastroenterology

## 2021-04-25 ENCOUNTER — Other Ambulatory Visit: Payer: Self-pay

## 2021-04-25 ENCOUNTER — Ambulatory Visit (INDEPENDENT_AMBULATORY_CARE_PROVIDER_SITE_OTHER): Payer: BC Managed Care – PPO | Admitting: Gastroenterology

## 2021-04-25 ENCOUNTER — Other Ambulatory Visit: Payer: Self-pay | Admitting: Primary Care

## 2021-04-25 VITALS — BP 110/72 | HR 77 | Ht 65.0 in | Wt 150.0 lb

## 2021-04-25 DIAGNOSIS — K219 Gastro-esophageal reflux disease without esophagitis: Secondary | ICD-10-CM | POA: Diagnosis not present

## 2021-04-25 DIAGNOSIS — Z1211 Encounter for screening for malignant neoplasm of colon: Secondary | ICD-10-CM | POA: Diagnosis not present

## 2021-04-25 DIAGNOSIS — R1310 Dysphagia, unspecified: Secondary | ICD-10-CM | POA: Diagnosis not present

## 2021-04-25 NOTE — Patient Instructions (Addendum)
If you are age 50 or older, your body mass index should be between 23-30. Your Body mass index is 24.96 kg/m. If this is out of the aforementioned range listed, please consider follow up with your Primary Care Provider.  If you are age 39 or younger, your body mass index should be between 19-25. Your Body mass index is 24.96 kg/m. If this is out of the aformentioned range listed, please consider follow up with your Primary Care Provider.   ________________________________________________________  The [facility] providers would like to encourage you to use [facility] to communicate with providers for non-urgent requests or questions.  Due to long hold times on the telephone, sending your provider a message by [facility] may be a faster and more efficient way to get a response.  Please allow 48 business hours for a response.  Please remember that this is for non-urgent requests.  _______________________________________________________  Brooke Haas have been scheduled for an endoscopy and colonoscopy. Please follow the written instructions given to you at your visit today. Please pick up your prep supplies at the pharmacy within the next 1-3 days. If you use inhalers (even only as needed), please bring them with you on the day of your procedure.  We have given you samples of the following medication to take: Clenpiq  Continue current medications  Please call with any questions or concerns.  Thank you,  Dr. Jackquline Denmark

## 2021-04-25 NOTE — Progress Notes (Signed)
Chief Complaint: For GI evaluation  Referring Provider:  Greig Right, MD      ASSESSMENT AND PLAN;   #1. GERD with occ dysphagia.  Associated LPR and asthma  #2. Colorectal cancer screening   Plan: -EGD with eso Bx and possible dil/colon -Continue omeprazole 40mg  QAM and famotidine 40mg  po QHS -Nonpharmacologic means of reflux control.   I discussed EGD/Colonoscopy- the indications, risks, alternatives and potential complications including, but not limited to, bleeding, infection, reaction to medication, damage to internal organs, cardiac and/or pulmonary problems, and perforation requiring surgery.The possibility that significant findings could be missed was explained. All ? were answered. The patient gives consent to proceed.  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 More so after 2020 after COVID vaccine and then having COVID the same year. Occ dysphagia-intermittent, mid chest, mostly with solids. Has been closely followed by Dr. Neldon Mc and advised EGD to rule out EOE Had occasional heartburn- started on omeprazole 40 mg every morning and famotidine 40 mg p.o. nightly with resolution of heartburn but continued throat clearing.  Had extensive ENT evaluation by Dr. Gaylyn Cheers - Oct had septoplasty with some relief. Had negative pulmonary evaluation including PFTs. Sent to GI clinic for further eval.  Denies having any lower GI problems except for occasional diarrhea ever since she had cholecystectomy.  Has been advised to get colonoscopy as a part of colorectal cancer screening as well.  No family history of colon cancer or colonic polyps.  1. Asthma, moderate persistent, well-controlled   2. Perennial allergic rhinitis   3. LPRD (laryngopharyngeal reflux disease)     SH- Pharmacist.  4 children (3 older ones in college), husband Ronalee Belts.  LSCS x 3 (younger 1 is adopted)  Past  GI work-up: EGD 11/2004: Mild gastritis. Neg CLO, neg SB Bx  Laparoscopic cholecystectomy 09/2003 with negative IOC.  Dr. Noberto Retort  CT Abdo/pelvis 10/2004 stable cystic lesion posterior to right lobe of the liver.  Stable since 03/2004.   Past Medical History:  Diagnosis Date   Asthma    Breast mass, right    Breast mass, right    GERD (gastroesophageal reflux disease)    Headache     Past Surgical History:  Procedure Laterality Date   ABDOMINAL HYSTERECTOMY     BREAST BIOPSY Left    BREAST EXCISIONAL BIOPSY Right    CESAREAN SECTION     CHOLECYSTECTOMY     ESOPHAGOGASTRODUODENOSCOPY  12/11/2004   Mild gastritis. Otherwise normal EGD   RADIOACTIVE SEED GUIDED EXCISIONAL BREAST BIOPSY Right 12/01/2016   Procedure: RIGHT RADIOACTIVE SEED GUIDED EXCISIONAL BREAST BIOPSY;  Surgeon: Rolm Bookbinder, MD;  Location: [facility];  Service: General;  Laterality: Right;   ROTATOR CUFF REPAIR Right    SINOSCOPY     TONSILLECTOMY      Family History  Problem Relation Age of Onset   Allergic rhinitis Mother    Breast cancer Mother 73   Allergic rhinitis Father    Allergic rhinitis Sister    Asthma Sister    Colon cancer Neg Hx    Pancreatic cancer Neg Hx    Liver cancer Neg Hx    Esophageal cancer Neg Hx    Rectal cancer Neg Hx     Social History   Tobacco Use   Smoking status: Never   Smokeless tobacco: Never  Vaping Use   Vaping Use: Never used  Substance Use Topics   Alcohol use: Yes    Comment: social   Drug use: No    Current Outpatient Medications  Medication Sig Dispense Refill   albuterol (VENTOLIN HFA) 108 (90 Base) MCG/ACT inhaler INHALE 2 PUFFS BY MOUTH INTO THE LUNGS EVERY 6 HOURS AS NEEDED FOR WHEEZING OR SHORTNESS OF BREATH 8.5 g 3   budesonide-formoterol (SYMBICORT) 160-4.5 MCG/ACT inhaler Inhale 2 puffs into the lungs 2 (two) times daily. 1 each 6   Cholecalciferol 100 MCG (4000 UT) CAPS Take by mouth.     estradiol (ESTRACE) 0.1 MG/GM  vaginal cream Place vaginally.     famotidine (PEPCID) 40 MG tablet Take 1 tablet (40 mg total) by mouth every evening. 30 tablet 5   Fluticasone Propionate (FLONASE NA) Place into the nose.     Multiple Vitamin (MULTIVITAMIN) capsule Take 1 capsule by mouth daily.     naratriptan (AMERGE) 2.5 MG tablet Take 2.5 mg by mouth as needed for migraine. Take one (1) tablet at onset of headache; if returns or does not resolve, may repeat after 4 hours; do not exceed five (5) mg in 24 hours.     omeprazole (PRILOSEC) 40 MG capsule Take 1 capsule (40 mg total) by mouth daily. 30 capsule 5   No current facility-administered medications for this visit.    Allergies  Allergen Reactions   Aleve [Naproxen Sodium] Anaphylaxis    Review of Systems:  Constitutional: Denies fever, chills, diaphoresis, appetite change and fatigue.  HEENT: Has multiple allergies with hoarseness of voice. Respiratory: Denies SOB, DOE, has cough, chest tightness,  and wheezing.   Cardiovascular: Denies chest pain, palpitations and leg swelling.  Genitourinary: Denies dysuria, urgency, frequency, hematuria, flank pain and difficulty urinating.  Musculoskeletal: Denies myalgias, back pain, joint swelling, arthralgias and gait problem.  Skin: No rash.  Neurological: Denies dizziness, seizures, syncope, weakness, light-headedness, numbness and has headaches.  Hematological: Denies adenopathy. Easy bruising, personal or family bleeding history  Psychiatric/Behavioral: No anxiety or depression     Physical Exam:    BP 110/72    Pulse 77    Ht 5\' 5"  (1.651 m)    Wt 150 lb (68 kg)    SpO2 97%    BMI 24.96 kg/m  Wt Readings from Last 3 Encounters:  04/25/21 150 lb (68 kg)  03/06/21 151 lb (68.5 kg)  10/04/20 146 lb (66.2 kg)   Constitutional:  Well-developed, in no acute distress. Psychiatric: Normal mood and affect. Behavior is normal. HEENT: Pupils normal.  Conjunctivae are normal. No scleral icterus. Cardiovascular:  Normal rate, regular rhythm. No edema Pulmonary/chest: Effort normal and breath sounds normal. No wheezing, rales or rhonchi. Abdominal: Soft, nondistended. Nontender. Bowel sounds active throughout. There are no masses palpable. No hepatomegaly. Rectal: Deferred Neurological: Alert and oriented to person place and time. Skin: Skin is warm and dry. No rashes noted.  Data Reviewed: I have personally reviewed following labs and imaging studies  CBC: CBC Latest Ref Rng & Units 08/23/2020 08/31/2019  WBC 4.0 - 10.5 K/uL 6.6 5.8  Hemoglobin 12.0 - 15.0 g/dL 13.6 14.6  Hematocrit 36.0 - 46.0 % 40.5 42.9  Platelets 150.0 - 400.0 K/uL 227.0 293.0      Carmell Austria, MD 04/25/2021, 1:45 PM  Cc: Greig Right, MD

## 2021-04-30 ENCOUNTER — Encounter: Payer: Self-pay | Admitting: Gastroenterology

## 2021-05-15 DIAGNOSIS — F4320 Adjustment disorder, unspecified: Secondary | ICD-10-CM | POA: Diagnosis not present

## 2021-05-22 ENCOUNTER — Other Ambulatory Visit: Payer: Self-pay | Admitting: Allergy and Immunology

## 2021-05-22 DIAGNOSIS — F4320 Adjustment disorder, unspecified: Secondary | ICD-10-CM | POA: Diagnosis not present

## 2021-05-28 DIAGNOSIS — F4320 Adjustment disorder, unspecified: Secondary | ICD-10-CM | POA: Diagnosis not present

## 2021-05-29 ENCOUNTER — Encounter: Payer: Self-pay | Admitting: Gastroenterology

## 2021-05-29 ENCOUNTER — Ambulatory Visit: Payer: BC Managed Care – PPO | Admitting: Allergy and Immunology

## 2021-06-03 ENCOUNTER — Other Ambulatory Visit: Payer: Self-pay | Admitting: Obstetrics and Gynecology

## 2021-06-03 DIAGNOSIS — Z13820 Encounter for screening for osteoporosis: Secondary | ICD-10-CM

## 2021-06-05 ENCOUNTER — Other Ambulatory Visit: Payer: Self-pay | Admitting: Obstetrics and Gynecology

## 2021-06-05 DIAGNOSIS — Z13820 Encounter for screening for osteoporosis: Secondary | ICD-10-CM

## 2021-06-12 ENCOUNTER — Encounter: Payer: BC Managed Care – PPO | Admitting: Gastroenterology

## 2021-06-16 DIAGNOSIS — U071 COVID-19: Secondary | ICD-10-CM

## 2021-06-16 HISTORY — DX: COVID-19: U07.1

## 2021-06-24 ENCOUNTER — Telehealth: Payer: Self-pay | Admitting: Gastroenterology

## 2021-06-24 NOTE — Telephone Encounter (Signed)
Good morning Dr. Lyndel Safe, patient called stating she tested positive for covid so she rescheduled her procedure from 06/25/2021 to 07/23/2021.

## 2021-06-24 NOTE — Telephone Encounter (Signed)
Thanks for letting me know RG 

## 2021-06-25 ENCOUNTER — Encounter: Payer: BC Managed Care – PPO | Admitting: Gastroenterology

## 2021-06-26 DIAGNOSIS — F4320 Adjustment disorder, unspecified: Secondary | ICD-10-CM | POA: Diagnosis not present

## 2021-06-27 ENCOUNTER — Ambulatory Visit: Payer: BC Managed Care – PPO | Admitting: Allergy and Immunology

## 2021-07-09 ENCOUNTER — Ambulatory Visit (AMBULATORY_SURGERY_CENTER): Payer: BC Managed Care – PPO | Admitting: *Deleted

## 2021-07-09 ENCOUNTER — Other Ambulatory Visit: Payer: Self-pay

## 2021-07-09 ENCOUNTER — Ambulatory Visit: Payer: BC Managed Care – PPO | Admitting: Primary Care

## 2021-07-09 VITALS — Ht 65.0 in | Wt 147.0 lb

## 2021-07-09 DIAGNOSIS — F4320 Adjustment disorder, unspecified: Secondary | ICD-10-CM | POA: Diagnosis not present

## 2021-07-09 DIAGNOSIS — K219 Gastro-esophageal reflux disease without esophagitis: Secondary | ICD-10-CM

## 2021-07-09 DIAGNOSIS — Z1211 Encounter for screening for malignant neoplasm of colon: Secondary | ICD-10-CM

## 2021-07-09 DIAGNOSIS — R1310 Dysphagia, unspecified: Secondary | ICD-10-CM

## 2021-07-09 NOTE — Progress Notes (Signed)
No egg or soy allergy known to patient  ?No issues known to pt with past sedation with any surgeries or procedures ?Patient denies ever being told they had issues or difficulty with intubation  ?No FH of Malignant Hyperthermia ?Pt is not on diet pills ?Pt is not on  home 02  ?Pt is not on blood thinners  ?Pt denies issues with constipation  ?No A fib or A flutter ? ?Pt is fully vaccinated  for Covid and had covid 06-16-21  ? ?Pt states she has a CLenpiq kit at home that she was given in the office by Dr Lyndel Safe  ? ?Due to the COVID-19 pandemic we are asking patients to follow certain guidelines in PV and the [place]   ?Pt aware of COVID protocols and [facility] guidelines  ? ?PV completed over the phone. Pt verified name, DOB, address and insurance during PV today.  ?Pt emailed instruction packet to [email or web address] ? ?Pt encouraged to call with questions or issues.  ?If pt has My chart, procedure instructions sent via My Chart  ? ?

## 2021-07-12 ENCOUNTER — Other Ambulatory Visit: Payer: Self-pay

## 2021-07-12 ENCOUNTER — Encounter: Payer: Self-pay | Admitting: Primary Care

## 2021-07-12 ENCOUNTER — Ambulatory Visit: Payer: BC Managed Care – PPO | Admitting: Primary Care

## 2021-07-12 DIAGNOSIS — K219 Gastro-esophageal reflux disease without esophagitis: Secondary | ICD-10-CM

## 2021-07-12 DIAGNOSIS — J4531 Mild persistent asthma with (acute) exacerbation: Secondary | ICD-10-CM | POA: Diagnosis not present

## 2021-07-12 DIAGNOSIS — Z6825 Body mass index (BMI) 25.0-25.9, adult: Secondary | ICD-10-CM | POA: Diagnosis not present

## 2021-07-12 DIAGNOSIS — E7800 Pure hypercholesterolemia, unspecified: Secondary | ICD-10-CM | POA: Diagnosis not present

## 2021-07-12 DIAGNOSIS — G43009 Migraine without aura, not intractable, without status migrainosus: Secondary | ICD-10-CM | POA: Diagnosis not present

## 2021-07-12 MED ORDER — BUDESONIDE-FORMOTEROL FUMARATE 160-4.5 MCG/ACT IN AERO: 2.0000 | INHALATION_SPRAY | Freq: Two times a day (BID) | RESPIRATORY_TRACT | 6 refills | Status: DC

## 2021-07-12 NOTE — Assessment & Plan Note (Signed)
-   Patient has reflux symptoms, taking Prilosec '40mg'$  daily and Pepcid '40mg'$  daily. Scheduled for endoscopy/colonoscopy end of March.  ?

## 2021-07-12 NOTE — Assessment & Plan Note (Addendum)
-   Stable interval; No recent exacerbations requiring oral prednisone. ACT score 19. She reports improvement in asthma symptoms with maintenance inhaler. Currently on Symbicort 189mg two puffs twice daily. Occasional SABA use, approx 1-2 times a week. No changes. Follow-up in 1 year or sooner if needed.   ?

## 2021-07-12 NOTE — Progress Notes (Signed)
? ?'@Patient'$  ID: Brooke Haas, female    DOB: 04/04/1971, 51 y.o.   MRN: [id number] ? ?Chief Complaint  ?Patient presents with  ? Follow-up  ? ? ?Referring provider: ?Brooke Right, MD ? ?HPI: ?51 year old female, never smoked.  Past medical history significant for asthma, GERD, seasonal allergies. Patient of Dr. Ander Haas. ? ?07/12/2021 -Interim  ?Patient presents today for annual follow-up/asthma. She has seen a marked improvement since last year with her asthma symptoms with maintenance ICS/LABA. Symbicort was increased to 112mg two puffs twice daily in June 2022. She had covid in February 2023 and was treated with paxlovid. Last couple of nights she has needed to use albuterol inhaler. Her typical asthma symptoms consistent of chest tightness, shortness of breath and occasional cough. She reports impovement with albuterol hfa. She had allergy testing done with allergist and nothing specific was found. She has some GERD symptoms and is scheduled for endo/colonoscopy end on March. ? ?Pulmonary testing: ?10/04/20 PFTs- FVC 3.79 (1035), FEV1 3.14 (108%), TLC 113%, ratio 83, +11% change in FEV1 p BD  ? ? ?Allergies  ?Allergen Reactions  ? Aleve [Naproxen Sodium] Anaphylaxis  ? ? ?Immunization History  ?Administered Date(s) Administered  ? Influenza,inj,quad, With Preservative 02/02/2018  ? Influenza-Unspecified 01/25/2020  ? J[employer] (J[employer]J) SARS-COV-2 Vaccination 02/15/2020  ? ? ?Past Medical History:  ?Diagnosis Date  ? Allergy   ? seasonal  ? Anemia   ? years ago prior to hysterectomy  ? Asthma   ? Breast mass, Haas   ? Breast mass, Haas   ? COVID-19 virus infection 06/16/2021  ? GERD (gastroesophageal reflux disease)   ? Headache   ? ? ?Tobacco History: ?Social History  ? ?Tobacco Use  ?Smoking Status Never  ?Smokeless Tobacco Never  ? ?Counseling given: Not Answered ? ? ?Outpatient Medications Prior to Visit  ?Medication Sig Dispense Refill  ? albuterol (VENTOLIN HFA) 108 (90 Base) MCG/ACT inhaler INHALE 2  PUFFS BY MOUTH INTO THE LUNGS EVERY 6 HOURS AS NEEDED FOR WHEEZING OR SHORTNESS OF BREATH 8.5 g 3  ? Cholecalciferol 100 MCG (4000 UT) CAPS Take by mouth.    ? Cyanocobalamin (VITAMIN B 12 PO) Take by mouth.    ? estradiol (ESTRACE) 0.1 MG/GM vaginal cream Place vaginally.    ? famotidine (PEPCID) 40 MG tablet TAKE 1 TABLET BY MOUTH EVERY EVENING 30 tablet 1  ? Fluticasone Propionate (FLONASE NA) Place into the nose.    ? Multiple Vitamin (MULTIVITAMIN) capsule Take 1 capsule by mouth daily.    ? naratriptan (AMERGE) 2.5 MG tablet Take 2.5 mg by mouth as needed for migraine. Take one (1) tablet at onset of headache; if returns or does not resolve, may repeat after 4 hours; do not exceed five (5) mg in 24 hours.    ? omeprazole (PRILOSEC) 40 MG capsule Take 1 capsule (40 mg total) by mouth daily. 30 capsule 5  ? SYMBICORT 160-4.5 MCG/ACT inhaler INHALE 2 PUFFS BY MOUTH TWICE DAILY 10.2 g 2  ? ?No facility-administered medications prior to visit.  ? ? ?Review of Systems ? ?Review of Systems  ?Constitutional: Negative.   ?Respiratory:  Negative for cough, chest tightness, shortness of breath and wheezing.   ?Cardiovascular: Negative.   ? ? ?Physical Exam ? ?BP 104/62 (BP Location: Left Arm, Cuff Size: Normal)   Pulse 78   Ht '5\' 5"'$  (1.651 m)   Wt 150 lb 12.8 oz (68.4 kg)   SpO2 97%   BMI 25.09 kg/m?  ?Physical Exam ?Constitutional:   ?  Appearance: Normal appearance.  ?HENT:  ?   Head: Normocephalic and atraumatic.  ?   Mouth/Throat:  ?   Mouth: Mucous membranes are moist.  ?   Pharynx: Oropharynx is clear.  ?Cardiovascular:  ?   Rate and Rhythm: Normal rate and regular rhythm.  ?Pulmonary:  ?   Effort: Pulmonary effort is normal.  ?   Breath sounds: Normal breath sounds. No wheezing.  ?Skin: ?   General: Skin is warm and dry.  ?Neurological:  ?   General: No focal deficit present.  ?   Mental Status: She is alert and oriented to person, place, and time. Mental status is at baseline.  ?Psychiatric:     ?   Mood  and Affect: Mood normal.     ?   Behavior: Behavior normal.     ?   Thought Content: Thought content normal.     ?   Judgment: Judgment normal.  ?  ? ?Lab Results: ? ?CBC ?   ?Component Value Date/Time  ? WBC 6.6 08/23/2020 1214  ? RBC 4.41 08/23/2020 1214  ? HGB 13.6 08/23/2020 1214  ? HCT 40.5 08/23/2020 1214  ? PLT 227.0 08/23/2020 1214  ? MCV 91.8 08/23/2020 1214  ? MCHC 33.6 08/23/2020 1214  ? RDW 13.5 08/23/2020 1214  ? LYMPHSABS 2.1 08/23/2020 1214  ? MONOABS 0.5 08/23/2020 1214  ? EOSABS 0.1 08/23/2020 1214  ? BASOSABS 0.0 08/23/2020 1214  ? ? ?BMET ?No results found for: NA, K, CL, CO2, GLUCOSE, BUN, CREATININE, CALCIUM, GFRNONAA, GFRAA ? ?BNP ?No results found for: BNP ? ?ProBNP ?No results found for: PROBNP ? ?Imaging: ?No results found. ? ? ?Assessment & Plan:  ? ?Asthma ?- Stable interval; No recent exacerbations requiring oral prednisone. ACT score 19. She reports improvement in asthma symptoms with maintenance inhaler. Currently on Symbicort 150mg two puffs twice daily. Occasional SABA use, approx 1-2 times a week. No changes. Follow-up in 1 year or sooner if needed.   ? ?GERD (gastroesophageal reflux disease) ?- Patient has reflux symptoms, taking Prilosec '40mg'$  daily and Pepcid '40mg'$  daily. Scheduled for endoscopy/colonoscopy end of March.  ? ? ?EMartyn Ehrich NP ?07/12/2021 ? ?

## 2021-07-12 NOTE — Patient Instructions (Addendum)
Recommendations: ?- Continue Symbicort 154mg- two puffs morning and evening (rinse mouth after use) ?- Use albuterol 2 puffs every 6 hours for breakthrough shortness of breath/wheezing/chest tightness ?- Take OTC antihistamine during spring/fall months and/or flonase nasal spray as needed for post nasal drip symptoms  ?- If needing to use your rescue inhaler more than 1-2 times a day OR you are experiencing increased shortness of breath/chest tightness symptoms please call our office for acute visit ? ?Follow-up: ?1 year with Dr. OAnder Slade ? ?Asthma, Adult ?Asthma is a long-term (chronic) condition in which the airways get tight and narrow. The airways are the breathing passages that lead from the nose and mouth down into the lungs. A person with asthma will have times when symptoms get worse. These are called asthma attacks. They can cause coughing, whistling sounds when you breathe (wheezing), shortness of breath, and chest pain. They can make it hard to breathe. There is no cure for asthma, but medicines and lifestyle changes can help control it. ?There are many things that can bring on an asthma attack or make asthma symptoms worse (triggers). Common triggers include: ?Mold. ?Dust. ?Cigarette smoke. ?Cockroaches. ?Things that can cause allergy symptoms (allergens). These include animal skin flakes (dander) and pollen from trees or grass. ?Things that pollute the air. These may include household cleaners, wood smoke, smog, or chemical odors. ?Cold air, weather changes, and wind. ?Crying or laughing hard. ?Stress. ?Certain medicines or drugs. ?Certain foods such as dried fruit, potato chips, and grape juice. ?Infections, such as a cold or the flu. ?Certain medical conditions or diseases. ?Exercise or tiring activities. ?Asthma may be treated with medicines and by staying away from the things that cause asthma attacks. Types of medicines may include: ?Controller medicines. These help prevent asthma symptoms. They  are usually taken every day. ?Fast-acting reliever or rescue medicines. These quickly relieve asthma symptoms. They are used as needed and provide short-term relief. ?Allergy medicines if your attacks are brought on by allergens. ?Medicines to help control the body's defense (immune) system. ?Follow these instructions at home: ?Avoiding triggers in your home ?Change your heating and air conditioning filter often. ?Limit your use of fireplaces and wood stoves. ?Get rid of pests (such as roaches and mice) and their droppings. ?Throw away plants if you see mold on them. ?Clean your floors. Dust regularly. Use cleaning products that do not smell. ?Have someone vacuum when you are not home. Use a vacuum cleaner with a HEPA filter if possible. ?Replace carpet with wood, tile, or vinyl flooring. Carpet can trap animal skin flakes and dust. ?Use allergy-proof pillows, mattress covers, and box spring covers. ?Wash bed sheets and blankets every week in hot water. Dry them in a dryer. ?Keep your bedroom free of any triggers. ?Avoid pets and keep windows closed when things that cause allergy symptoms are in the air. ?Use blankets that are made of polyester or cotton. ?Clean bathrooms and kitchens with bleach. If possible, have someone repaint the walls in these rooms with mold-resistant paint. Keep out of the rooms that are being cleaned and painted. ?Wash your hands often with soap and water. If soap and water are not available, use hand sanitizer. ?Do not allow anyone to smoke in your home. ?General instructions ?Take over-the-counter and prescription medicines only as told by your doctor. ?Talk with your doctor if you have questions about how or when to take your medicines. ?Make note if you need to use your medicines more often than usual. ?Do  not use any products that contain nicotine or tobacco, such as cigarettes and e-cigarettes. If you need help quitting, ask your doctor. ?Stay away from secondhand smoke. ?Avoid doing  things outdoors when allergen counts are high and when air quality is low. ?Wear a ski mask when doing outdoor activities in the winter. The mask should cover your nose and mouth. Exercise indoors on cold days if you can. ?Warm up before you exercise. Take time to cool down after exercise. ?Use a peak flow meter as told by your doctor. A peak flow meter is a tool that measures how well the lungs are working. ?Keep track of the peak flow meter's readings. Write them down. ?Follow your asthma action plan. This is a written plan for taking care of your asthma and treating your attacks. ?Make sure you get all the shots (vaccines) that your doctor recommends. Ask your doctor about a flu shot and a pneumonia shot. ?Keep all follow-up visits as told by your doctor. This is important. ?Contact a doctor if: ?You have wheezing, shortness of breath, or a cough even while taking medicine to prevent attacks. ?The mucus you cough up (sputum) is thicker than usual. ?The mucus you cough up changes from clear or white to yellow, green, gray, or bloody. ?You have problems from the medicine you are taking, such as: ?A rash. ?Itching. ?Swelling. ?Trouble breathing. ?You need reliever medicines more than 2-3 times a week. ?Your peak flow reading is still at 50-79% of your personal best after following the action plan for 1 hour. ?You have a fever. ?Get help right away if: ?You seem to be worse and are not responding to medicine during an asthma attack. ?You are short of breath even at rest. ?You get short of breath when doing very little activity. ?You have trouble eating, drinking, or talking. ?You have chest pain or tightness. ?You have a fast heartbeat. ?Your lips or fingernails start to turn blue. ?You are light-headed or dizzy, or you faint. ?Your peak flow is less than 50% of your personal best. ?You feel too tired to breathe normally. ?Summary ?Asthma is a long-term (chronic) condition in which the airways get tight and narrow.  An asthma attack can make it hard to breathe. ?Asthma cannot be cured, but medicines and lifestyle changes can help control it. ?Make sure you understand how to avoid triggers and how and when to use your medicines. ?This information is not intended to replace advice given to you by your health care provider. Make sure you discuss any questions you have with your health care provider. ?Document Revised: 08/07/2019 Document Reviewed: 08/17/2019 ?Elsevier Patient Education ? [place]. ? ?

## 2021-07-19 ENCOUNTER — Encounter: Payer: Self-pay | Admitting: Gastroenterology

## 2021-07-23 ENCOUNTER — Ambulatory Visit (AMBULATORY_SURGERY_CENTER): Payer: BC Managed Care – PPO | Admitting: Gastroenterology

## 2021-07-23 ENCOUNTER — Encounter: Payer: Self-pay | Admitting: Gastroenterology

## 2021-07-23 VITALS — BP 101/46 | HR 75 | Temp 98.1°F | Resp 9 | Ht 65.0 in | Wt 147.0 lb

## 2021-07-23 DIAGNOSIS — K2950 Unspecified chronic gastritis without bleeding: Secondary | ICD-10-CM | POA: Diagnosis not present

## 2021-07-23 DIAGNOSIS — R1310 Dysphagia, unspecified: Secondary | ICD-10-CM | POA: Diagnosis not present

## 2021-07-23 DIAGNOSIS — Z1211 Encounter for screening for malignant neoplasm of colon: Secondary | ICD-10-CM | POA: Diagnosis not present

## 2021-07-23 DIAGNOSIS — K2970 Gastritis, unspecified, without bleeding: Secondary | ICD-10-CM

## 2021-07-23 DIAGNOSIS — K229 Disease of esophagus, unspecified: Secondary | ICD-10-CM | POA: Diagnosis not present

## 2021-07-23 DIAGNOSIS — K219 Gastro-esophageal reflux disease without esophagitis: Secondary | ICD-10-CM

## 2021-07-23 DIAGNOSIS — D132 Benign neoplasm of duodenum: Secondary | ICD-10-CM

## 2021-07-23 MED ORDER — SODIUM CHLORIDE 0.9 % IV SOLN
500.0000 mL | Freq: Once | INTRAVENOUS | Status: DC
Start: 2021-07-23 — End: 2021-07-23

## 2021-07-23 NOTE — Progress Notes (Signed)
Pt in recovery with monitors in place, VSS. Report given to receiving RN. Bite guard was placed with pt awake to ensure comfort. No dental or soft tissue damage noted. 

## 2021-07-23 NOTE — Op Note (Signed)
[place] ?Patient Name: Brooke Haas ?Procedure Date: 07/23/2021 2:09 PM ?MRN: [id number] ?Endoscopist: Jackquline Denmark , MD ?Age: 51 ?Referring MD:  ?Date of Birth: 10-Oct-1970 ?Gender: Female ?Account #: [id number] ?Procedure:                Upper GI endoscopy ?Indications:              Dysphagia. GERD ?Medicines:                Monitored Anesthesia Care ?Procedure:                Pre-Anesthesia Assessment: ?                          - Prior to the procedure, a History and Physical  ?                          was performed, and patient medications and  ?                          allergies were reviewed. The patient's tolerance of  ?                          previous anesthesia was also reviewed. The risks  ?                          and benefits of the procedure and the sedation  ?                          options and risks were discussed with the patient.  ?                          All questions were answered, and informed consent  ?                          was obtained. Prior Anticoagulants: The patient has  ?                          taken no previous anticoagulant or antiplatelet  ?                          agents. ASA Grade Assessment: II - A patient with  ?                          mild systemic disease. After reviewing the risks  ?                          and benefits, the patient was deemed in  ?                          satisfactory condition to undergo the procedure. ?                          After obtaining informed consent, the endoscope was  ?  passed under direct vision. Throughout the  ?                          procedure, the patient's blood pressure, pulse, and  ?                          oxygen saturations were monitored continuously. The  ?                          Endoscope was introduced through the mouth, and  ?                          advanced to the second part of duodenum. The upper  ?                          GI endoscopy was accomplished without  difficulty.  ?                          The patient tolerated the procedure well. ?Scope In: ?Scope Out: ?Findings:                 Few circular and linear furrows were noted. Z-line  ?                          was well-defined at 35 cm, examined by NBI. No  ?                          strictures. Biopsies were obtained from the  ?                          proximal and distal esophagus with cold forceps for  ?                          histology of suspected eosinophilic esophagitis.  ?                          The scope was withdrawn. Dilation was performed  ?                          with a Maloney dilator with mild resistance at 50  ?                          Fr. ?                          The entire examined stomach was normal. Biopsies  ?                          were taken with a cold forceps for histology. ?                          A single 10 mm benign-appearing flat, sessile polyp  ?  with no bleeding was found in the duodenal bulb.  ?                          Biopsies were taken with a cold forceps for  ?                          histology. ?                          The examined duodenum was normal. Biopsies for  ?                          histology were taken with a cold forceps for  ?                          evaluation of celiac disease. ?Complications:            No immediate complications. ?Estimated Blood Loss:     Estimated blood loss: none. ?Impression:               - Few circular and linear furrows in the esophagus.  ?                          Biopsied. Dilated. ?                          - Normal stomach. Biopsied. ?                          - A single duodenal polyp. Biopsied. ?                          - Normal examined duodenum. Biopsied. ?Recommendation:           - Patient has a contact number available for  ?                          emergencies. The signs and symptoms of potential  ?                          delayed complications were discussed with the  ?                           patient. Return to normal activities tomorrow.  ?                          Written discharge instructions were provided to the  ?                          patient. ?                          - Post dilatation diet. ?                          - Continue present medications. ?                          -  Await pathology results. ?                          - The findings and recommendations were discussed  ?                          with the patient's family. ?Jackquline Denmark, MD ?07/23/2021 2:49:32 PM ?This report has been signed electronically. ?

## 2021-07-23 NOTE — Progress Notes (Signed)
Called to room to assist during endoscopic procedure.  Patient ID and intended procedure confirmed with present staff. Received instructions for my participation in the procedure from the performing physician.  

## 2021-07-23 NOTE — Progress Notes (Signed)
? ? ?Chief Complaint: For GI evaluation ? ?Referring Provider:  Greig Right, MD    ? ? ?ASSESSMENT AND PLAN;  ? ?#1. GERD with occ dysphagia.  Associated LPR and asthma ? ?#2. Colorectal cancer screening ? ? ?Plan: ?-EGD with eso Bx and possible dil/colon ?-Continue omeprazole '40mg'$  QAM and famotidine '40mg'$  po QHS ?-Nonpharmacologic means of reflux control. ? ? ?I discussed EGD/Colonoscopy- the indications, risks, alternatives and potential complications including, but not limited to, bleeding, infection, reaction to medication, damage to internal organs, cardiac and/or pulmonary problems, and perforation requiring surgery.The possibility that significant findings could be missed was explained. All ? were answered. The patient gives consent to proceed. ? ?HPI:   ? 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 ?C/O GERD in form of throat clearing more prominently postprandially. ?More so after 2020 after COVID vaccine and then having COVID the same year. ?Occ dysphagia-intermittent, mid chest, mostly with solids. ?Has been closely followed by Dr. Neldon Mc and advised EGD to rule out EOE ?Had occasional heartburn- started on omeprazole 40 mg every morning and famotidine 40 mg p.o. nightly with resolution of heartburn but continued throat clearing. ? ?Had extensive ENT evaluation by Dr. Gaylyn Cheers - Oct had septoplasty with some relief. ?Had negative pulmonary evaluation including PFTs. ?Sent to GI clinic for further eval. ? ?Denies having any lower GI problems except for occasional diarrhea ever since she had cholecystectomy. ? ?Has been advised to get colonoscopy as a part of colorectal cancer screening as well. ? ?No family history of colon cancer or colonic polyps. ? ?1. Asthma, moderate persistent, well-controlled   ?2. Perennial allergic rhinitis   ?3. LPRD (laryngopharyngeal reflux disease)   ? ? 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  4 children (3 older ones in college), husband Ronalee Belts.  LSCS x 3 (younger 1 is adopted) ? ?Past  GI work-up: ?EGD 11/2004: Mild gastritis. Neg CLO, neg SB Bx ? ?Laparoscopic cholecystectomy 09/2003 with negative IOC.  Dr. Noberto Retort ? ?CT Abdo/pelvis 10/2004 stable cystic lesion posterior to right lobe of the liver.  Stable since 03/2004. ? ? ?Past Medical History:  ?Diagnosis Date  ? Allergy   ? seasonal  ? Anemia   ? years ago prior to hysterectomy  ? Asthma   ? Breast mass, right   ? Breast mass, right   ? COVID-19 virus infection 06/16/2021  ? GERD (gastroesophageal reflux disease)   ? Headache   ? Hyperlipidemia   ? ? ?Past Surgical History:  ?Procedure Laterality Date  ? ABDOMINAL HYSTERECTOMY    ? BREAST BIOPSY Left   ? BREAST EXCISIONAL BIOPSY Right   ? CESAREAN SECTION    ? x 3  ? CHOLECYSTECTOMY    ? ESOPHAGOGASTRODUODENOSCOPY  12/11/2004  ? Mild gastritis. Otherwise normal EGD  ? RADIOACTIVE SEED GUIDED EXCISIONAL BREAST BIOPSY Right 12/01/2016  ? Procedure: RIGHT RADIOACTIVE SEED GUIDED EXCISIONAL BREAST BIOPSY;  Surgeon: Rolm Bookbinder, MD;  Location: [facility];  Service: General;  Laterality: Right;  ? ROTATOR CUFF REPAIR Right   ? SINOSCOPY    ? TONSILLECTOMY    ? ? ?Family History  ?Problem Relation Age of Onset  ? Allergic rhinitis Mother   ? Breast cancer Mother 64  ? Allergic rhinitis Father   ? Allergic rhinitis Sister   ? Asthma Sister   ? Colon cancer Neg Hx   ? Pancreatic cancer Neg Hx   ? Liver cancer Neg Hx   ? Esophageal cancer Neg Hx   ? Rectal  cancer Neg Hx   ? Colon polyps Neg Hx   ? Stomach cancer Neg Hx   ? ? ?Social History  ? ?Tobacco Use  ? Smoking status: Never  ? Smokeless tobacco: Never  ?Vaping Use  ? Vaping Use: Never used  ?Substance Use Topics  ? Alcohol use: Yes  ?  Comment: social  ? Drug use: No  ? ? ?Current Outpatient Medications  ?Medication Sig Dispense Refill  ? albuterol (VENTOLIN HFA) 108 (90 Base) MCG/ACT inhaler INHALE 2 PUFFS BY MOUTH INTO THE LUNGS EVERY 6 HOURS AS NEEDED FOR WHEEZING OR SHORTNESS OF BREATH 8.5 g 3  ? atorvastatin (LIPITOR)  10 MG tablet Take 10 mg by mouth daily after supper.    ? budesonide-formoterol (SYMBICORT) 160-4.5 MCG/ACT inhaler Inhale 2 puffs into the lungs 2 (two) times daily. 10.2 g 6  ? Cholecalciferol 100 MCG (4000 UT) CAPS Take by mouth.    ? Cyanocobalamin (VITAMIN B 12 PO) Take by mouth.    ? famotidine (PEPCID) 40 MG tablet TAKE 1 TABLET BY MOUTH EVERY EVENING 30 tablet 1  ? Fluticasone Propionate (FLONASE NA) Place into the nose.    ? Multiple Vitamin (MULTIVITAMIN) capsule Take 1 capsule by mouth daily.    ? naratriptan (AMERGE) 2.5 MG tablet Take 2.5 mg by mouth as needed for migraine. Take one (1) tablet at onset of headache; if returns or does not resolve, may repeat after 4 hours; do not exceed five (5) mg in 24 hours.    ? omeprazole (PRILOSEC) 40 MG capsule Take 1 capsule (40 mg total) by mouth daily. 30 capsule 5  ? estradiol (ESTRACE) 0.1 MG/GM vaginal cream Place vaginally.    ? ?Current Facility-Administered Medications  ?Medication Dose Route Frequency Provider Last Rate Last Admin  ? 0.9 %  sodium chloride infusion  500 mL Intravenous Once Jackquline Denmark, MD      ? ? ?Allergies  ?Allergen Reactions  ? Aleve [Naproxen Sodium] Anaphylaxis  ? ? ?Review of Systems:  ?Constitutional: Denies fever, chills, diaphoresis, appetite change and fatigue.  ?HEENT: Has multiple allergies with hoarseness of voice. ?Respiratory: Denies SOB, DOE, has cough, chest tightness,  and wheezing.   ?Cardiovascular: Denies chest pain, palpitations and leg swelling.  ?Genitourinary: Denies dysuria, urgency, frequency, hematuria, flank pain and difficulty urinating.  ?Musculoskeletal: Denies myalgias, back pain, joint swelling, arthralgias and gait problem.  ?Skin: No rash.  ?Neurological: Denies dizziness, seizures, syncope, weakness, light-headedness, numbness and has headaches.  ?Hematological: Denies adenopathy. Easy bruising, personal or family bleeding history  ?Psychiatric/Behavioral: No anxiety or depression ? ?  ? ?Physical  Exam:   ? ?BP 104/64   Pulse 72   Temp 98.1 ?F (36.7 ?C) (Temporal)   Ht '5\' 5"'$  (1.651 m)   Wt 147 lb (66.7 kg)   SpO2 99%   BMI 24.46 kg/m?  ?Wt Readings from Last 3 Encounters:  ?07/23/21 147 lb (66.7 kg)  ?07/12/21 150 lb 12.8 oz (68.4 kg)  ?07/09/21 147 lb (66.7 kg)  ? ?Constitutional:  Well-developed, in no acute distress. ?Psychiatric: Normal mood and affect. Behavior is normal. ?HEENT: Pupils normal.  Conjunctivae are normal. No scleral icterus. ?Cardiovascular: Normal rate, regular rhythm. No edema ?Pulmonary/chest: Effort normal and breath sounds normal. No wheezing, rales or rhonchi. ?Abdominal: Soft, nondistended. Nontender. Bowel sounds active throughout. There are no masses palpable. No hepatomegaly. ?Rectal: Deferred ?Neurological: Alert and oriented to person place and time. ?Skin: Skin is warm and dry. No rashes noted. ? ?Data Reviewed:  I have personally reviewed following labs and imaging studies ? ?CBC: ? ?  Latest Ref Rng & Units 08/23/2020  ? 12:14 PM 08/31/2019  ?  1:42 PM  ?CBC  ?WBC 4.0 - 10.5 K/uL 6.6   5.8    ?Hemoglobin 12.0 - 15.0 g/dL 13.6   14.6    ?Hematocrit 36.0 - 46.0 % 40.5   42.9    ?Platelets 150.0 - 400.0 K/uL 227.0   293.0    ? ? ? ? ?Carmell Austria, MD 07/23/2021, 2:04 PM ? ?Cc: Greig Right, MD ? ? ?

## 2021-07-23 NOTE — Patient Instructions (Signed)
Handout on post dilation diet given to patient.  ?Await pathology results. ?Repeat colonoscopy for surveillance in 10 years unless any changes. ? ?YOU HAD AN ENDOSCOPIC PROCEDURE TODAY AT [facility]:   Refer to the procedure report that was given to you for any specific questions about what was found during the examination.  If the procedure report does not answer your questions, please call your gastroenterologist to clarify.  If you requested that your care partner not be given the details of your procedure findings, then the procedure report has been included in a sealed envelope for you to review at your convenience later. ? ?YOU SHOULD EXPECT: Some feelings of bloating in the abdomen. Passage of more gas than usual.  Walking can help get rid of the air that was put into your GI tract during the procedure and reduce the bloating. If you had a lower endoscopy (such as a colonoscopy or flexible sigmoidoscopy) you may notice spotting of blood in your stool or on the toilet paper. If you underwent a bowel prep for your procedure, you may not have a normal bowel movement for a few days. ? ?Please Note:  You might notice some irritation and congestion in your nose or some drainage.  This is from the oxygen used during your procedure.  There is no need for concern and it should clear up in a day or so. ? ?SYMPTOMS TO REPORT IMMEDIATELY: ? ?Following lower endoscopy (colonoscopy or flexible sigmoidoscopy): ? Excessive amounts of blood in the stool ? Significant tenderness or worsening of abdominal pains ? Swelling of the abdomen that is new, acute ? Fever of 100?F or higher ? ?Following upper endoscopy (EGD) ? Vomiting of blood or coffee ground material ? New chest pain or pain under the shoulder blades ? Painful or persistently difficult swallowing ? New shortness of breath ? Fever of 100?F or higher ? Black, tarry-looking stools ? ?For urgent or emergent issues, a gastroenterologist can be reached at  any hour by calling [phone]. ?Do not use MyChart messaging for urgent concerns.  ? ? ?DIET:  We do recommend a small meal at first, but then you may proceed to your regular diet.  Drink plenty of fluids but you should avoid alcoholic beverages for 24 hours. ? ?ACTIVITY:  You should plan to take it easy for the rest of today and you should NOT DRIVE or use heavy machinery until tomorrow (because of the sedation medicines used during the test).   ? ?FOLLOW UP: ?Our staff will call the number listed on your records 48-72 hours following your procedure to check on you and address any questions or concerns that you may have regarding the information given to you following your procedure. If we do not reach you, we will leave a message.  We will attempt to reach you two times.  During this call, we will ask if you have developed any symptoms of COVID 19. If you develop any symptoms (ie: fever, flu-like symptoms, shortness of breath, cough etc.) before then, please call [phone].  If you test positive for Covid 19 in the 2 weeks post procedure, please call and report this information to [place].   ? ?If any biopsies were taken you will be contacted by phone or by letter within the next 1-3 weeks.  Please call us at [phone] if you have not heard about the biopsies in 3 weeks.  ? ? ?SIGNATURES/CONFIDENTIALITY: ?You and/or your care partner have signed paperwork which  will be entered into your electronic medical record.  These signatures attest to the fact that that the information above on your After Visit Summary has been reviewed and is understood.  Full responsibility of the confidentiality of this discharge information lies with you and/or your care-partner.  ?

## 2021-07-23 NOTE — Op Note (Signed)
[place] ?Patient Name: Brooke Haas ?Procedure Date: 07/23/2021 2:08 PM ?MRN: [id number] ?Endoscopist: Jackquline Denmark , MD ?Age: 51 ?Referring MD:  ?Date of Birth: 07/07/1970 ?Gender: Female ?Account #: [id number] ?Procedure:                Colonoscopy ?Indications:              Screening for colorectal malignant neoplasm ?Medicines:                Monitored Anesthesia Care ?Procedure:                Pre-Anesthesia Assessment: ?                          - Prior to the procedure, a History and Physical  ?                          was performed, and patient medications and  ?                          allergies were reviewed. The patient's tolerance of  ?                          previous anesthesia was also reviewed. The risks  ?                          and benefits of the procedure and the sedation  ?                          options and risks were discussed with the patient.  ?                          All questions were answered, and informed consent  ?                          was obtained. Prior Anticoagulants: The patient has  ?                          taken no previous anticoagulant or antiplatelet  ?                          agents. ASA Grade Assessment: II - A patient with  ?                          mild systemic disease. After reviewing the risks  ?                          and benefits, the patient was deemed in  ?                          satisfactory condition to undergo the procedure. ?                          After obtaining informed consent, the colonoscope  ?  was passed under direct vision. Throughout the  ?                          procedure, the patient's blood pressure, pulse, and  ?                          oxygen saturations were monitored continuously. The  ?                          [id number]-HQ190L Colonoscope was introduced through the  ?                          anus and advanced to the 2 cm into the ileum. The  ?                          colonoscopy was  performed without difficulty. The  ?                          patient tolerated the procedure well. The quality  ?                          of the bowel preparation was good. The terminal  ?                          ileum, ileocecal valve, appendiceal orifice, and  ?                          rectum were photographed. ?Scope In: 2:30:32 PM ?Scope Out: 2:44:09 PM ?Scope Withdrawal Time: 0 hours 9 minutes 54 seconds  ?Total Procedure Duration: 0 hours 13 minutes 37 seconds  ?Findings:                 The colon (entire examined portion) appeared normal. ?                          Non-bleeding internal hemorrhoids were found during  ?                          retroflexion. The hemorrhoids were small and Grade  ?                          I (internal hemorrhoids that do not prolapse). ?                          The terminal ileum appeared normal. ?                          The exam was otherwise without abnormality on  ?                          direct and retroflexion views. ?Complications:            No immediate complications. ?Estimated Blood Loss:     Estimated blood loss: none. ?Impression:               -  Small internal hemorrhoids. ?                          - Otherwise normal colonoscopy to TI. ?                          - No specimens collected. ?Recommendation:           - Patient has a contact number available for  ?                          emergencies. The signs and symptoms of potential  ?                          delayed complications were discussed with the  ?                          patient. Return to normal activities tomorrow.  ?                          Written discharge instructions were provided to the  ?                          patient. ?                          - Resume previous diet. ?                          - Continue present medications. ?                          - Repeat colonoscopy in 10 years for screening  ?                          purposes. Earlier, if with any new problems or  ?                           change in family history. ?                          - The findings and recommendations were discussed  ?                          with the patient's family. ?Jackquline Denmark, MD ?07/23/2021 2:51:47 PM ?This report has been signed electronically. ?

## 2021-07-24 DIAGNOSIS — G8929 Other chronic pain: Secondary | ICD-10-CM | POA: Diagnosis not present

## 2021-07-24 DIAGNOSIS — M25511 Pain in right shoulder: Secondary | ICD-10-CM | POA: Diagnosis not present

## 2021-07-25 ENCOUNTER — Telehealth: Payer: Self-pay

## 2021-07-25 NOTE — Telephone Encounter (Signed)
?  Follow up Call- ? ? ?  07/23/2021  ?  1:41 PM  ?Call back number  ?Post procedure Call Back phone  # [phone]  ?Permission to leave phone message Yes  ?  ? ?Patient questions: ? ?Do you have a fever, pain , or abdominal swelling? No. ?Pain Score  0 * ? ?Have you tolerated food without any problems? Yes.   ? ?Have you been able to return to your normal activities? Yes.   ? ?Do you have any questions about your discharge instructions: ?Diet   No. ?Medications  No. ?Follow up visit  No. ? ?Do you have questions or concerns about your Care? No. ? ?Actions: ?* If pain score is 4 or above: ?No action needed, pain <4. ? ? ?

## 2021-07-31 DIAGNOSIS — F4320 Adjustment disorder, unspecified: Secondary | ICD-10-CM | POA: Diagnosis not present

## 2021-08-01 DIAGNOSIS — M75111 Incomplete rotator cuff tear or rupture of right shoulder, not specified as traumatic: Secondary | ICD-10-CM | POA: Diagnosis not present

## 2021-08-01 DIAGNOSIS — M25511 Pain in right shoulder: Secondary | ICD-10-CM | POA: Diagnosis not present

## 2021-08-01 DIAGNOSIS — M19011 Primary osteoarthritis, right shoulder: Secondary | ICD-10-CM | POA: Diagnosis not present

## 2021-08-04 ENCOUNTER — Encounter: Payer: Self-pay | Admitting: Gastroenterology

## 2021-08-12 DIAGNOSIS — M25511 Pain in right shoulder: Secondary | ICD-10-CM | POA: Diagnosis not present

## 2021-08-12 DIAGNOSIS — G8929 Other chronic pain: Secondary | ICD-10-CM | POA: Diagnosis not present

## 2021-08-12 DIAGNOSIS — M75111 Incomplete rotator cuff tear or rupture of right shoulder, not specified as traumatic: Secondary | ICD-10-CM | POA: Diagnosis not present

## 2021-08-13 ENCOUNTER — Ambulatory Visit
Admission: RE | Admit: 2021-08-13 | Discharge: 2021-08-13 | Disposition: A | Discharge status: Home / Self Care | Payer: BC Managed Care – PPO | Source: Ambulatory Visit | Attending: Obstetrics and Gynecology | Admitting: Obstetrics and Gynecology

## 2021-08-13 DIAGNOSIS — Z13820 Encounter for screening for osteoporosis: Secondary | ICD-10-CM

## 2021-08-13 DIAGNOSIS — Z780 Asymptomatic menopausal state: Secondary | ICD-10-CM | POA: Diagnosis not present

## 2021-08-13 DIAGNOSIS — M8589 Other specified disorders of bone density and structure, multiple sites: Secondary | ICD-10-CM | POA: Diagnosis not present

## 2021-08-23 DIAGNOSIS — F4320 Adjustment disorder, unspecified: Secondary | ICD-10-CM | POA: Diagnosis not present

## 2021-08-28 ENCOUNTER — Other Ambulatory Visit: Payer: Self-pay | Admitting: Obstetrics and Gynecology

## 2021-08-28 DIAGNOSIS — N6489 Other specified disorders of breast: Secondary | ICD-10-CM

## 2021-08-28 DIAGNOSIS — Z9189 Other specified personal risk factors, not elsewhere classified: Secondary | ICD-10-CM

## 2021-09-04 DIAGNOSIS — F4320 Adjustment disorder, unspecified: Secondary | ICD-10-CM | POA: Diagnosis not present

## 2021-09-07 ENCOUNTER — Ambulatory Visit
Admission: RE | Admit: 2021-09-07 | Discharge: 2021-09-07 | Disposition: A | Discharge status: Home / Self Care | Payer: BC Managed Care – PPO | Source: Ambulatory Visit | Attending: Obstetrics and Gynecology | Admitting: Obstetrics and Gynecology

## 2021-09-07 DIAGNOSIS — N6489 Other specified disorders of breast: Secondary | ICD-10-CM | POA: Diagnosis not present

## 2021-09-07 DIAGNOSIS — R928 Other abnormal and inconclusive findings on diagnostic imaging of breast: Secondary | ICD-10-CM | POA: Diagnosis not present

## 2021-09-07 DIAGNOSIS — Z9189 Other specified personal risk factors, not elsewhere classified: Secondary | ICD-10-CM

## 2021-09-07 MED ORDER — GADOBUTROL 1 MMOL/ML IV SOLN
7.0000 mL | Freq: Once | INTRAVENOUS | Status: AC | PRN
  Administered 2021-09-07: 7 mL via INTRAVENOUS

## 2021-09-26 DIAGNOSIS — F4320 Adjustment disorder, unspecified: Secondary | ICD-10-CM | POA: Diagnosis not present

## 2021-10-15 ENCOUNTER — Other Ambulatory Visit: Payer: Self-pay | Admitting: Allergy and Immunology

## 2021-10-17 DIAGNOSIS — G43009 Migraine without aura, not intractable, without status migrainosus: Secondary | ICD-10-CM | POA: Diagnosis not present

## 2021-10-17 DIAGNOSIS — E7800 Pure hypercholesterolemia, unspecified: Secondary | ICD-10-CM | POA: Diagnosis not present

## 2021-10-23 DIAGNOSIS — F4320 Adjustment disorder, unspecified: Secondary | ICD-10-CM | POA: Diagnosis not present

## 2021-12-04 ENCOUNTER — Other Ambulatory Visit: Payer: Self-pay | Admitting: Allergy and Immunology

## 2021-12-06 ENCOUNTER — Other Ambulatory Visit: Payer: Self-pay | Admitting: Obstetrics and Gynecology

## 2021-12-06 DIAGNOSIS — Z1231 Encounter for screening mammogram for malignant neoplasm of breast: Secondary | ICD-10-CM

## 2021-12-12 ENCOUNTER — Encounter: Payer: Self-pay | Admitting: Gastroenterology

## 2021-12-15 NOTE — Telephone Encounter (Signed)
Brooke Haas, Lets try Carafate elixir 1 g p.o. 4 times daily x 2 weeks.  If it helps, we may use it twice daily x another 2 to 4 weeks. Looks like Protonix is not working or you are getting used to it.  Lets try Aciphex 20 mg p.o. every morning.  It may work better. Please check your weight every week and record it We are planning to do FU EGD for follow-up of duodenal adenoma.  Lovenia Shuck, We can proceed with EGD in September or October at [facility].  Please have a spot for her RG

## 2021-12-16 ENCOUNTER — Other Ambulatory Visit: Payer: Self-pay

## 2021-12-16 DIAGNOSIS — K219 Gastro-esophageal reflux disease without esophagitis: Secondary | ICD-10-CM

## 2021-12-16 MED ORDER — RABEPRAZOLE SODIUM 20 MG PO TBEC: 20.0000 mg | DELAYED_RELEASE_TABLET | Freq: Every day | ORAL | 3 refills | Status: DC

## 2021-12-16 MED ORDER — SUCRALFATE 1 GM/10ML PO SUSP: 1.0000 g | Freq: Four times a day (QID) | ORAL | 1 refills | Status: DC

## 2021-12-23 DIAGNOSIS — K219 Gastro-esophageal reflux disease without esophagitis: Secondary | ICD-10-CM | POA: Diagnosis not present

## 2021-12-23 DIAGNOSIS — J4540 Moderate persistent asthma, uncomplicated: Secondary | ICD-10-CM | POA: Diagnosis not present

## 2021-12-25 ENCOUNTER — Ambulatory Visit: Payer: BC Managed Care – PPO | Admitting: Primary Care

## 2021-12-26 ENCOUNTER — Other Ambulatory Visit (HOSPITAL_COMMUNITY): Payer: Self-pay

## 2021-12-26 ENCOUNTER — Telehealth: Payer: Self-pay

## 2021-12-26 NOTE — Telephone Encounter (Signed)
Patient Advocate Encounter   Received notification from CoverMyMeds that prior authorization is required for Rabeprazole '20MG'$ . PA submitted and APPROVED on 12/26/2021.  [facility] Effective: 12/26/2021 - 12/25/2022  Clista Bernhardt, CPhT Rx Patient Advocate Phone: [phone]

## 2021-12-29 ENCOUNTER — Other Ambulatory Visit: Payer: Self-pay | Admitting: Primary Care

## 2021-12-31 ENCOUNTER — Other Ambulatory Visit (HOSPITAL_COMMUNITY): Payer: Self-pay

## 2022-01-08 ENCOUNTER — Ambulatory Visit
Admission: RE | Admit: 2022-01-08 | Discharge: 2022-01-08 | Disposition: A | Discharge status: Home / Self Care | Payer: BC Managed Care – PPO | Source: Ambulatory Visit | Attending: Obstetrics and Gynecology | Admitting: Obstetrics and Gynecology

## 2022-01-08 DIAGNOSIS — Z1231 Encounter for screening mammogram for malignant neoplasm of breast: Secondary | ICD-10-CM

## 2022-01-13 ENCOUNTER — Ambulatory Visit (AMBULATORY_SURGERY_CENTER): Payer: BC Managed Care – PPO | Admitting: *Deleted

## 2022-01-13 VITALS — Ht 65.0 in | Wt 129.8 lb

## 2022-01-13 DIAGNOSIS — D132 Benign neoplasm of duodenum: Secondary | ICD-10-CM

## 2022-01-13 NOTE — Progress Notes (Signed)
No egg or soy allergy known to patient  No issues known to pt with past sedation with any surgeries or procedures Patient denies ever being told they had issues or difficulty with intubation  No FH of Malignant Hyperthermia Pt is not on diet pills Pt is not on  home 02  Pt is not on blood thinners

## 2022-01-20 DIAGNOSIS — Z13820 Encounter for screening for osteoporosis: Secondary | ICD-10-CM | POA: Diagnosis not present

## 2022-01-20 DIAGNOSIS — Z01419 Encounter for gynecological examination (general) (routine) without abnormal findings: Secondary | ICD-10-CM | POA: Diagnosis not present

## 2022-01-21 ENCOUNTER — Encounter: Payer: Self-pay | Admitting: Gastroenterology

## 2022-02-04 ENCOUNTER — Ambulatory Visit (AMBULATORY_SURGERY_CENTER): Payer: BC Managed Care – PPO | Admitting: Gastroenterology

## 2022-02-04 ENCOUNTER — Encounter: Payer: Self-pay | Admitting: Gastroenterology

## 2022-02-04 VITALS — BP 100/49 | HR 66 | Temp 96.8°F | Resp 13 | Ht 65.0 in | Wt 129.0 lb

## 2022-02-04 DIAGNOSIS — D132 Benign neoplasm of duodenum: Secondary | ICD-10-CM

## 2022-02-04 DIAGNOSIS — R1310 Dysphagia, unspecified: Secondary | ICD-10-CM | POA: Diagnosis not present

## 2022-02-04 MED ORDER — SODIUM CHLORIDE 0.9 % IV SOLN: 500.0000 mL | Freq: Once | INTRAVENOUS | Status: DC

## 2022-02-04 NOTE — Op Note (Signed)
[place] Patient Name: Brooke Haas Procedure Date: 02/04/2022 9:32 AM MRN: [id number] Endoscopist: Jackquline Denmark , MD Age: 51 Referring MD:  Date of Birth: 04-10-1971 Gender: Female Account #: [id number] Procedure:                Upper GI endoscopy Indications:              Dysphagia. Prev dx duodenal adenomatous polyp Medicines:                Monitored Anesthesia Care Procedure:                Pre-Anesthesia Assessment:                           - Prior to the procedure, a History and Physical                            was performed, and patient medications and                            allergies were reviewed. The patient's tolerance of                            previous anesthesia was also reviewed. The risks                            and benefits of the procedure and the sedation                            options and risks were discussed with the patient.                            All questions were answered, and informed consent                            was obtained. Prior Anticoagulants: The patient has                            taken no previous anticoagulant or antiplatelet                            agents. ASA Grade Assessment: II - A patient with                            mild systemic disease. After reviewing the risks                            and benefits, the patient was deemed in                            satisfactory condition to undergo the procedure.                           After obtaining informed consent, the endoscope was  passed under direct vision. Throughout the                            procedure, the patient's blood pressure, pulse, and                            oxygen saturations were monitored continuously. The                            GIF [id number] #[id number] was introduced through the                            mouth, and advanced to the second part of duodenum.                            The upper  GI endoscopy was accomplished without                            difficulty. The patient tolerated the procedure                            well. Scope In: Scope Out: Findings:                 The examined esophagus was normal with well-defined                            Z-line at 35 cm, examined by NBI. The scope was                            withdrawn. Dilation was performed with a Maloney                            dilator with mild resistance at 50 Fr and 52 Fr.                            with esophageal biopsies were not performed since                            recent biopsies were negative for EoE                           The entire examined stomach was normal.                           A single 6 mm sessile polyp with no bleeding was                            found in the duodenal bulb (smaller then before).                            The polyp was removed with a cold snare. We removed  additional deeper layer. Resection and retrieval                            were complete. Complications:            No immediate complications. Estimated Blood Loss:     Estimated blood loss: none. Impression:               - Normal esophagus. Dilated.                           - Normal stomach.                           - A single duodenal polyp. Resected and retrieved. Recommendation:           - Patient has a contact number available for                            emergencies. The signs and symptoms of potential                            delayed complications were discussed with the                            patient. Return to normal activities tomorrow.                            Written discharge instructions were provided to the                            patient.                           - Post dilatation diet.                           - Continue present medications.                           - Await pathology results.                           - No  aspirin, ibuprofen, naproxen, or other                            non-steroidal anti-inflammatory drugs for 5 days                            after polyp removal.                           - The findings and recommendations were discussed                            with Ronalee Belts. Jackquline Denmark, MD 02/04/2022 10:07:56 AM This report has been signed electronically.

## 2022-02-04 NOTE — Progress Notes (Signed)
Chief Complaint: For GI evaluation  Referring Provider:  Greig Right, MD      ASSESSMENT AND PLAN;   #1. GERD #2. 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 More so after 2020 after COVID vaccine and then having COVID the same year. Occ dysphagia-intermittent, mid chest, mostly with solids. Has been closely followed by Dr. Neldon Mc and advised EGD to rule out EOE Had occasional heartburn- started on omeprazole 40 mg every morning and famotidine 40 mg p.o. nightly with resolution of heartburn but continued throat clearing.  Had extensive ENT evaluation by Dr. Gaylyn Cheers - Oct had septoplasty with some relief. Had negative pulmonary evaluation including PFTs. Sent to GI clinic for further eval.  Denies having any lower GI problems except for occasional diarrhea ever since she had cholecystectomy.  Has been advised to get colonoscopy as a part of colorectal cancer screening as well.  No family history of colon cancer or colonic polyps.  1. Asthma, moderate persistent, well-controlled   2. Perennial allergic rhinitis   3. LPRD (laryngopharyngeal reflux disease)     SH- Pharmacist.  4 children (3 older ones in college), husband Ronalee Belts.  LSCS x 3 (younger 1 is adopted)  Past GI work-up: EGD 11/2004: Mild gastritis. Neg CLO, neg SB Bx  Laparoscopic cholecystectomy 09/2003 with negative IOC.  Dr. Noberto Retort  CT Abdo/pelvis 10/2004 stable cystic lesion posterior to right lobe of the liver.  Stable since 03/2004.   Past Medical History:  Diagnosis Date   Allergy    seasonal   Anemia    years ago prior to hysterectomy   Asthma    Breast mass, right    Breast mass, right    COVID-19 virus infection 06/16/2021   GERD (gastroesophageal reflux disease)    Headache    Hyperlipidemia     Past Surgical History:  Procedure Laterality Date   ABDOMINAL  HYSTERECTOMY     BREAST BIOPSY Left    BREAST EXCISIONAL BIOPSY Right    CESAREAN SECTION     x 3   CHOLECYSTECTOMY     COLONOSCOPY     ESOPHAGOGASTRODUODENOSCOPY  12/11/2004   Mild gastritis. Otherwise normal EGD   RADIOACTIVE SEED GUIDED EXCISIONAL BREAST BIOPSY Right 12/01/2016   Procedure: RIGHT RADIOACTIVE SEED GUIDED EXCISIONAL BREAST BIOPSY;  Surgeon: Rolm Bookbinder, MD;  Location: [facility];  Service: General;  Laterality: Right;   ROTATOR CUFF REPAIR Right    SINOSCOPY     TONSILLECTOMY     UPPER GASTROINTESTINAL ENDOSCOPY      Family History  Problem Relation Age of Onset   Allergic rhinitis Mother    Breast cancer Mother 87   Allergic rhinitis Father    Allergic rhinitis Sister    Asthma Sister    Breast cancer Paternal Grandmother        50s   Colon cancer Neg Hx    Pancreatic cancer Neg Hx    Liver cancer Neg Hx    Esophageal cancer Neg Hx    Rectal cancer Neg Hx    Colon polyps Neg Hx    Stomach cancer Neg Hx     Social History   Tobacco Use   Smoking status: Never   Smokeless tobacco: Never  Vaping Use   Vaping Use: Never used  Substance Use Topics   Alcohol use: Yes  Comment: social   Drug use: No    Current Outpatient Medications  Medication Sig Dispense Refill   BREZTRI AEROSPHERE 160-9-4.8 MCG/ACT AERO 2 puffs BID     Cholecalciferol 100 MCG (4000 UT) CAPS Take by mouth.     famotidine (PEPCID) 40 MG tablet TAKE 1 TABLET BY MOUTH EVERY EVENING 30 tablet 1   Fluticasone Propionate (FLONASE NA) Place into the nose.     montelukast (SINGULAIR) 10 MG tablet TAKE 1 TABLET BY MOUTH AT BEDTIME 30 tablet 3   Multiple Vitamin (MULTIVITAMIN) capsule Take 1 capsule by mouth daily.     naratriptan (AMERGE) 2.5 MG tablet Take 2.5 mg by mouth as needed for migraine. Take one (1) tablet at onset of headache; if returns or does not resolve, may repeat after 4 hours; do not exceed five (5) mg in 24 hours.     pantoprazole (PROTONIX) 40  MG tablet Take 40 mg by mouth daily.     rosuvastatin (CRESTOR) 10 MG tablet Take 10 mg by mouth daily.     topiramate (TOPAMAX) 50 MG tablet Take 50-100 mg by mouth at bedtime.     albuterol (VENTOLIN HFA) 108 (90 Base) MCG/ACT inhaler INHALE 2 PUFFS BY MOUTH INTO THE LUNGS EVERY 6 HOURS AS NEEDED FOR WHEEZING OR SHORTNESS OF BREATH 8.5 g 3   atorvastatin (LIPITOR) 10 MG tablet Take 10 mg by mouth daily after supper. (Patient not taking: Reported on 02/04/2022)     budesonide-formoterol (SYMBICORT) 160-4.5 MCG/ACT inhaler Inhale 2 puffs into the lungs 2 (two) times daily. (Patient not taking: Reported on 02/04/2022) 10.2 g 6   Cyanocobalamin (VITAMIN B 12 PO) Take by mouth. (Patient not taking: Reported on 01/13/2022)     estradiol (ESTRACE) 0.1 MG/GM vaginal cream Place vaginally as needed.     omeprazole (PRILOSEC) 40 MG capsule Take 1 capsule (40 mg total) by mouth daily. (Patient not taking: Reported on 02/04/2022) 30 capsule 5   RABEprazole (ACIPHEX) 20 MG tablet Take 1 tablet (20 mg total) by mouth daily. (Patient not taking: Reported on 02/04/2022) 90 tablet 3   sucralfate (CARAFATE) 1 GM/10ML suspension Take 10 mLs (1 g total) by mouth 4 (four) times daily. Take 10 mLs ( 1 g total) by mouth 4 ( four) times daily for 2 weeks 420 mL 1   Current Facility-Administered Medications  Medication Dose Route Frequency Provider Last Rate Last Admin   0.9 %  sodium chloride infusion  500 mL Intravenous Once Jackquline Denmark, MD        Allergies  Allergen Reactions   Aleve [Naproxen Sodium] Anaphylaxis    Review of Systems:  Constitutional: Denies fever, chills, diaphoresis, appetite change and fatigue.  HEENT: Has multiple allergies with hoarseness of voice. Respiratory: Denies SOB, DOE, has cough, chest tightness,  and wheezing.   Cardiovascular: Denies chest pain, palpitations and leg swelling.  Genitourinary: Denies dysuria, urgency, frequency, hematuria, flank pain and difficulty urinating.   Musculoskeletal: Denies myalgias, back pain, joint swelling, arthralgias and gait problem.  Skin: No rash.  Neurological: Denies dizziness, seizures, syncope, weakness, light-headedness, numbness and has headaches.  Hematological: Denies adenopathy. Easy bruising, personal or family bleeding history  Psychiatric/Behavioral: No anxiety or depression     Physical Exam:    BP 103/62   Pulse (!) 59   Temp (!) 96.8 F (36 C)   Resp 14   Ht '5\' 5"'$  (1.651 m)   Wt 129 lb (58.5 kg)   SpO2 100%   BMI 21.47  kg/m  Wt Readings from Last 3 Encounters:  02/04/22 129 lb (58.5 kg)  01/13/22 129 lb 12.8 oz (58.9 kg)  07/23/21 147 lb (66.7 kg)   Constitutional:  Well-developed, in no acute distress. Psychiatric: Normal mood and affect. Behavior is normal. HEENT: Pupils normal.  Conjunctivae are normal. No scleral icterus. Cardiovascular: Normal rate, regular rhythm. No edema Pulmonary/chest: Effort normal and breath sounds normal. No wheezing, rales or rhonchi. Abdominal: Soft, nondistended. Nontender. Bowel sounds active throughout. There are no masses palpable. No hepatomegaly. Rectal: Deferred Neurological: Alert and oriented to person place and time. Skin: Skin is warm and dry. No rashes noted.  Data Reviewed: I have personally reviewed following labs and imaging studies  CBC:    Latest Ref Rng & Units 08/23/2020   12:14 PM 08/31/2019    1:42 PM  CBC  WBC 4.0 - 10.5 K/uL 6.6  5.8   Hemoglobin 12.0 - 15.0 g/dL 13.6  14.6   Hematocrit 36.0 - 46.0 % 40.5  42.9   Platelets 150.0 - 400.0 K/uL 227.0  293.0       Carmell Austria, MD 02/04/2022, 9:40 AM  Cc: Greig Right, MD

## 2022-02-04 NOTE — Progress Notes (Signed)
Pt's states no medical or surgical changes since previsit or office visit. 

## 2022-02-04 NOTE — Progress Notes (Signed)
Report to PACU, RN, vss, BBS= Clear.  

## 2022-02-04 NOTE — Progress Notes (Signed)
Called to room to assist during endoscopic procedure.  Patient ID and intended procedure confirmed with present staff. Received instructions for my participation in the procedure from the performing physician.  

## 2022-02-04 NOTE — Patient Instructions (Addendum)
- Patient has a contact number available for emergencies. The signs and symptoms of potential delayed complications were discussed with the patient. Return to normal activities tomorrow. Written discharge instructions were provided to the patient. - Post dilatation diet. - Continue present medications. - Await pathology results. - No aspirin, ibuprofen, naproxen, or other non-steroidal anti-inflammatory drugs for 5 days after polyp removal. - The findings and recommendations were discussed with Brooke Haas.  Handout on post dilatation diet given.  YOU HAD AN ENDOSCOPIC PROCEDURE TODAY AT [facility]:   Refer to the procedure report that was given to you for any specific questions about what was found during the examination.  If the procedure report does not answer your questions, please call your gastroenterologist to clarify.  If you requested that your care partner not be given the details of your procedure findings, then the procedure report has been included in a sealed envelope for you to review at your convenience later.  YOU SHOULD EXPECT: Some feelings of bloating in the abdomen. Passage of more gas than usual.  Walking can help get rid of the air that was put into your GI tract during the procedure and reduce the bloating. If you had a lower endoscopy (such as a colonoscopy or flexible sigmoidoscopy) you may notice spotting of blood in your stool or on the toilet paper. If you underwent a bowel prep for your procedure, you may not have a normal bowel movement for a few days.  Please Note:  You might notice some irritation and congestion in your nose or some drainage.  This is from the oxygen used during your procedure.  There is no need for concern and it should clear up in a day or so.  SYMPTOMS TO REPORT IMMEDIATELY:  Following upper endoscopy (EGD)  Vomiting of blood or coffee ground material  New chest pain or pain under the shoulder blades  Painful or persistently  difficult swallowing  New shortness of breath  Fever of 100F or higher  Black, tarry-looking stools  For urgent or emergent issues, a gastroenterologist can be reached at any hour by calling [phone]. Do not use MyChart messaging for urgent concerns.    DIET:  We do recommend a small meal at first, but then you may proceed to your regular diet.  Drink plenty of fluids but you should avoid alcoholic beverages for 24 hours.  ACTIVITY:  You should plan to take it easy for the rest of today and you should NOT DRIVE or use heavy machinery until tomorrow (because of the sedation medicines used during the test).    FOLLOW UP: Our staff will call the number listed on your records the next business day following your procedure.  We will call around 7:15- 8:00 am to check on you and address any questions or concerns that you may have regarding the information given to you following your procedure. If we do not reach you, we will leave a message.     If any biopsies were taken you will be contacted by phone or by letter within the next 1-3 weeks.  Please call us at [phone] if you have not heard about the biopsies in 3 weeks.    SIGNATURES/CONFIDENTIALITY: You and/or your care partner have signed paperwork which will be entered into your electronic medical record.  These signatures attest to the fact that that the information above on your After Visit Summary has been reviewed and is understood.  Full responsibility of the confidentiality  of this discharge information lies with you and/or your care-partner.  

## 2022-02-05 ENCOUNTER — Telehealth: Payer: Self-pay

## 2022-02-05 NOTE — Telephone Encounter (Signed)
  Follow up Call-     02/04/2022    9:23 AM 07/23/2021    1:41 PM  Call back number  Post procedure Call Back phone  # [phone]  Permission to leave phone message Yes Yes     Patient questions:  Do you have a fever, pain , or abdominal swelling? No. Pain Score  0 *  Have you tolerated food without any problems? Yes.    Have you been able to return to your normal activities? Yes.    Do you have any questions about your discharge instructions: Diet   No. Medications  No. Follow up visit  No.  Do you have questions or concerns about your Care? No.  Actions: * If pain score is 4 or above: No action needed, pain <4.

## 2022-02-10 DIAGNOSIS — M25511 Pain in right shoulder: Secondary | ICD-10-CM | POA: Diagnosis not present

## 2022-02-10 DIAGNOSIS — M75111 Incomplete rotator cuff tear or rupture of right shoulder, not specified as traumatic: Secondary | ICD-10-CM | POA: Diagnosis not present

## 2022-02-10 DIAGNOSIS — G8929 Other chronic pain: Secondary | ICD-10-CM | POA: Diagnosis not present

## 2022-02-15 ENCOUNTER — Encounter: Payer: Self-pay | Admitting: Gastroenterology

## 2022-02-28 ENCOUNTER — Encounter: Payer: Self-pay | Admitting: Nurse Practitioner

## 2022-02-28 ENCOUNTER — Ambulatory Visit (INDEPENDENT_AMBULATORY_CARE_PROVIDER_SITE_OTHER): Payer: BC Managed Care – PPO | Admitting: Nurse Practitioner

## 2022-02-28 ENCOUNTER — Ambulatory Visit (INDEPENDENT_AMBULATORY_CARE_PROVIDER_SITE_OTHER): Payer: BC Managed Care – PPO

## 2022-02-28 VITALS — BP 102/64 | HR 77 | Ht 65.0 in | Wt 131.2 lb

## 2022-02-28 DIAGNOSIS — B9689 Other specified bacterial agents as the cause of diseases classified elsewhere: Secondary | ICD-10-CM

## 2022-02-28 DIAGNOSIS — J4530 Mild persistent asthma, uncomplicated: Secondary | ICD-10-CM

## 2022-02-28 DIAGNOSIS — R058 Other specified cough: Secondary | ICD-10-CM

## 2022-02-28 DIAGNOSIS — R0602 Shortness of breath: Secondary | ICD-10-CM | POA: Diagnosis not present

## 2022-02-28 DIAGNOSIS — J019 Acute sinusitis, unspecified: Secondary | ICD-10-CM

## 2022-02-28 LAB — POCT EXHALED NITRIC OXIDE: FeNO level (ppb): 12

## 2022-02-28 MED ORDER — PREDNISONE 20 MG PO TABS: 40.0000 mg | ORAL_TABLET | Freq: Every day | ORAL | 0 refills | Status: AC

## 2022-02-28 MED ORDER — BENZONATATE 200 MG PO CAPS: 200.0000 mg | ORAL_CAPSULE | Freq: Three times a day (TID) | ORAL | 1 refills | Status: DC | PRN

## 2022-02-28 MED ORDER — LEVALBUTEROL TARTRATE 45 MCG/ACT IN AERO: 2.0000 | INHALATION_SPRAY | Freq: Four times a day (QID) | RESPIRATORY_TRACT | 2 refills | Status: DC | PRN

## 2022-02-28 MED ORDER — DOXYCYCLINE HYCLATE 100 MG PO TABS: 100.0000 mg | ORAL_TABLET | Freq: Two times a day (BID) | ORAL | 0 refills | Status: AC

## 2022-02-28 NOTE — Progress Notes (Signed)
$'@Patient'h$  ID: Brooke Haas, female    DOB: 1970/06/24, 51 y.o.   MRN: [id number]  Chief Complaint  Patient presents with   Follow-up    Pt f/u she is currently experiencing hoarseness, SOB and drainage on the right side of her head. Symptoms present going on 1 month.     Referring provider: Greig Right, MD  HPI: 51 year old female, never smoker followed for mild asthma. She is a patient of Dr. Ander Slade and last seen in office 07/12/2021 by Volanda Napoleon NP. Past medical history significant for GERD and seasonal allergies.   TEST/EVENTS:   07/12/2021: Ok [place] with Volanda Napoleon NP. Marked improvement since last year with her asthma symptoms with maintenance ICS/LABA. She was increased to 160 mcg dose in June 2022. She had COVID in February 2023; treated with Paxlovid. Follow up 1 yr.  02/28/2022: Today - acute Patient presents today for acute visit. She has had a dry cough for the past 4 weeks. Initially started with sinus symptoms. She started taking allegra but hasn't noticed much difference. Today, her sinus symptoms feel worse. She has throbbing on the right side of her head and sinus tenderness. She does having some mild increase in shortness of breath. She denies any fevers, chills, hemoptysis, sore throat, vision changes, chest congestion, wheezing. At some point, she was changed from Symbicort to [facility] by her PCP. Does feel like this has helped. No steroids or abx in over a year. She doesn't use her rescue inhaler often because she feels like it makes her very jittery and feel bad.   Allergies  Allergen Reactions   Aleve [Naproxen Sodium] Anaphylaxis    Immunization History  Administered Date(s) Administered   Influenza,inj,quad, With Preservative 02/02/2018   Influenza-Unspecified 01/25/2020   Janssen (J&J) SARS-COV-2 Vaccination 02/15/2020    Past Medical History:  Diagnosis Date   Allergy    seasonal   Anemia    years ago prior to hysterectomy   Asthma    Breast mass, right     Breast mass, right    COVID-19 virus infection 06/16/2021   GERD (gastroesophageal reflux disease)    Headache    Hyperlipidemia     Tobacco History: Social History   Tobacco Use  Smoking Status Never  Smokeless Tobacco Never   Counseling given: Not Answered   Outpatient Medications Prior to Visit  Medication Sig Dispense Refill   BREZTRI AEROSPHERE 160-9-4.8 MCG/ACT AERO 2 puffs BID     Cholecalciferol 100 MCG (4000 UT) CAPS Take by mouth.     estradiol (ESTRACE) 0.1 MG/GM vaginal cream Place vaginally as needed.     famotidine (PEPCID) 40 MG tablet TAKE 1 TABLET BY MOUTH EVERY EVENING 30 tablet 1   Fluticasone Propionate (FLONASE NA) Place into the nose.     montelukast (SINGULAIR) 10 MG tablet TAKE 1 TABLET BY MOUTH AT BEDTIME 30 tablet 3   Multiple Vitamin (MULTIVITAMIN) capsule Take 1 capsule by mouth daily.     naratriptan (AMERGE) 2.5 MG tablet Take 2.5 mg by mouth as needed for migraine. Take one (1) tablet at onset of headache; if returns or does not resolve, may repeat after 4 hours; do not exceed five (5) mg in 24 hours.     pantoprazole (PROTONIX) 40 MG tablet Take 40 mg by mouth daily.     rosuvastatin (CRESTOR) 10 MG tablet Take 10 mg by mouth daily.     sucralfate (CARAFATE) 1 GM/10ML suspension Take 10 mLs (1 g total) by mouth 4 (  four) times daily. Take 10 mLs ( 1 g total) by mouth 4 ( four) times daily for 2 weeks (Patient taking differently: Take 1 g by mouth as needed. Take 10 mLs ( 1 g total) by mouth PRN) 420 mL 1   topiramate (TOPAMAX) 50 MG tablet Take 50-100 mg by mouth at bedtime.     albuterol (VENTOLIN HFA) 108 (90 Base) MCG/ACT inhaler INHALE 2 PUFFS BY MOUTH INTO THE LUNGS EVERY 6 HOURS AS NEEDED FOR WHEEZING OR SHORTNESS OF BREATH 8.5 g 3   atorvastatin (LIPITOR) 10 MG tablet Take 10 mg by mouth daily after supper.     budesonide-formoterol (SYMBICORT) 160-4.5 MCG/ACT inhaler Inhale 2 puffs into the lungs 2 (two) times daily. 10.2 g 6    Cyanocobalamin (VITAMIN B 12 PO) Take by mouth.     omeprazole (PRILOSEC) 40 MG capsule Take 1 capsule (40 mg total) by mouth daily. 30 capsule 5   RABEprazole (ACIPHEX) 20 MG tablet Take 1 tablet (20 mg total) by mouth daily. 90 tablet 3   No facility-administered medications prior to visit.     Review of Systems:   Constitutional: No weight loss or gain, night sweats, fevers, chills, fatigue, or lassitude. HEENT: No difficulty swallowing, tooth/dental problems, or sore throat. No sneezing, itching, ear ache. +sinus headaches, nasal congestion, post nasal drip CV:  No chest pain, orthopnea, PND, swelling in lower extremities, anasarca, dizziness, palpitations, syncope Resp: +shortness of breath with exertion/coughing spells; dry cough. No excess mucus or change in color of mucus. No hemoptysis. No wheezing.  No chest wall deformity GI:  No heartburn, indigestion, abdominal pain, nausea, vomiting Skin: No rash, lesions, ulcerations MSK:  No joint pain or swelling.   Neuro: No dizziness or lightheadedness.  Psych: No depression or anxiety. Mood stable.     Physical Exam:  BP 102/64   Pulse 77   Ht '5\' 5"'$  (1.651 m)   Wt 131 lb 3.2 oz (59.5 kg)   SpO2 98%   BMI 21.83 kg/m   GEN: Pleasant, interactive, well-developed; in no acute distress HEENT:  Normocephalic and atraumatic. EACs patent bilaterally. TM pearly gray with present light reflex bilaterally. PERRLA. Sclera white. Nasal turbinates erythematous, moist and patent bilaterally. No rhinorrhea present. Oropharynx erythematous and moist, without exudate or edema. Tenderness to maxillary sinuses R>L. No lesions, ulcerations.  NECK:  Supple w/ fair ROM. No JVD present. Normal carotid impulses w/o bruits. Thyroid symmetrical with no goiter or nodules palpated. No lymphadenopathy.   CV: RRR, no m/r/g, no peripheral edema. Pulses intact, +2 bilaterally. No cyanosis, pallor or clubbing. PULMONARY:  Unlabored, regular breathing. Clear  bilaterally A&P w/o wheezes/rales/rhonchi.  No accessory muscle use.  GI: BS present and normoactive. Soft, non-tender to palpation. No organomegaly or masses detected. MSK: No erythema, warmth or tenderness. Cap refil <2 sec all extrem. No deformities or joint swelling noted.  Neuro: A/Ox3. No focal deficits noted.   Skin: Warm, no lesions or rashe Psych: Normal affect and behavior. Judgement and thought content appropriate.     Lab Results:  CBC    Component Value Date/Time   WBC 6.6 08/23/2020 1214   RBC 4.41 08/23/2020 1214   HGB 13.6 08/23/2020 1214   HCT 40.5 08/23/2020 1214   PLT 227.0 08/23/2020 1214   MCV 91.8 08/23/2020 1214   MCHC 33.6 08/23/2020 1214   RDW 13.5 08/23/2020 1214   LYMPHSABS 2.1 08/23/2020 1214   MONOABS 0.5 08/23/2020 1214   EOSABS 0.1 08/23/2020 1214  BASOSABS 0.0 08/23/2020 1214    BMET No results found for: "NA", "K", "CL", "CO2", "GLUCOSE", "BUN", "CREATININE", "CALCIUM", "GFRNONAA", "GFRAA"  BNP No results found for: "BNP"   Imaging:  No results found.       Latest Ref Rng & Units 10/04/2020    8:58 AM  PFT Results  FVC-Pre L 3.52   FVC-Predicted Pre % 96   FVC-Post L 3.79   FVC-Predicted Post % 103   Pre FEV1/FVC % % 80   Post FEV1/FCV % % 83   FEV1-Pre L 2.80   FEV1-Predicted Pre % 96   FEV1-Post L 3.14   DLCO uncorrected ml/min/mmHg 14.78   DLCO UNC% % 67   DLCO corrected ml/min/mmHg 14.78   DLCO COR %Predicted % 67   DLVA Predicted % 94   TLC L 5.92   TLC % Predicted % 113   RV % Predicted % 58     Lab Results  Component Value Date   NITRICOXIDE 12 10/04/2020        Assessment & Plan:   Acute bacterial rhinosinusitis Persistent, worsening symptoms x 1 month. She has tried and failed conservative measures. We will treat her with empiric doxycycline and steroids. Encouraged to continue supportive care measures and OTC antihistamine.  Patient Instructions  Continue Breztri 2 puffs Twice daily. Brush tongue  and rinse mouth afterwards Change to levalbuterol (Xopenex) 2 puffs or 3 mL every 6 hours as needed for shortness of breath or wheezing. Notify if symptoms persist despite rescue inhaler/neb use. Continue singulair 1 tab At bedtime  Continue protonix 1 tab daily Continue flonase 2 sprays each nostril daily   Prednisone 40 mg daily for 5 days. Take in AM with food. Doxycycline 1 tab Twice daily for 7 days. Take with food. Wear sunscreen and avoid excessive sun exposure while taking as medication increases risk for sunburns Mucinex 600 mg Twice daily for chest congestion Delsym 2 tsp Twice daily for cough Benzonatate (tessalon perles) 1 capsule Three times a day for cough  Saline nasal irrigation 1-2 times a day   Chest x ray today  Follow up in 2 weeks with Dr. Ander Slade or Alanson Aly. If symptoms do not improve or worsen, please contact office for sooner follow up or seek emergency care.    Upper airway cough syndrome Cough is likely from upper airway irritation r/t postnasal drip. FeNO was normal today and no evidence of bronchospasm on exam. Cough control measures advised. See above.   Asthma Possible exacerbation; although FeNO is normal today. See above plan. She will continue Breztri for maintenance. She has trouble with jitteriness after using albuterol; we will trial change to Xopenex PRN to see if this helps. Continue singulair for trigger prevention.   I spent 35 minutes of dedicated to the care of this patient on the date of this encounter to include pre-visit review of records, face-to-face time with the patient discussing conditions above, post visit ordering of testing, clinical documentation with the electronic health record, making appropriate referrals as documented, and communicating necessary findings to members of the patients care team.  Clayton Bibles, NP 02/28/2022  Pt aware and understands NP's role.

## 2022-02-28 NOTE — Assessment & Plan Note (Signed)
Persistent, worsening symptoms x 1 month. She has tried and failed conservative measures. We will treat her with empiric doxycycline and steroids. Encouraged to continue supportive care measures and OTC antihistamine.  Patient Instructions  Continue Breztri 2 puffs Twice daily. Brush tongue and rinse mouth afterwards Change to levalbuterol (Xopenex) 2 puffs or 3 mL every 6 hours as needed for shortness of breath or wheezing. Notify if symptoms persist despite rescue inhaler/neb use. Continue singulair 1 tab At bedtime  Continue protonix 1 tab daily Continue flonase 2 sprays each nostril daily   Prednisone 40 mg daily for 5 days. Take in AM with food. Doxycycline 1 tab Twice daily for 7 days. Take with food. Wear sunscreen and avoid excessive sun exposure while taking as medication increases risk for sunburns Mucinex 600 mg Twice daily for chest congestion Delsym 2 tsp Twice daily for cough Benzonatate (tessalon perles) 1 capsule Three times a day for cough  Saline nasal irrigation 1-2 times a day   Chest x ray today  Follow up in 2 weeks with Dr. Ander Slade or Alanson Aly. If symptoms do not improve or worsen, please contact office for sooner follow up or seek emergency care.

## 2022-02-28 NOTE — Patient Instructions (Addendum)
Continue Breztri 2 puffs Twice daily. Brush tongue and rinse mouth afterwards Change to levalbuterol (Xopenex) 2 puffs or 3 mL every 6 hours as needed for shortness of breath or wheezing. Notify if symptoms persist despite rescue inhaler/neb use. Continue singulair 1 tab At bedtime  Continue protonix 1 tab daily Continue flonase 2 sprays each nostril daily   Prednisone 40 mg daily for 5 days. Take in AM with food. Doxycycline 1 tab Twice daily for 7 days. Take with food. Wear sunscreen and avoid excessive sun exposure while taking as medication increases risk for sunburns Mucinex 600 mg Twice daily for chest congestion Delsym 2 tsp Twice daily for cough Benzonatate (tessalon perles) 1 capsule Three times a day for cough  Saline nasal irrigation 1-2 times a day   Chest x ray today  Follow up in 2 weeks with Dr. Ander Slade or Alanson Aly. If symptoms do not improve or worsen, please contact office for sooner follow up or seek emergency care.

## 2022-02-28 NOTE — Assessment & Plan Note (Signed)
Cough is likely from upper airway irritation r/t postnasal drip. FeNO was normal today and no evidence of bronchospasm on exam. Cough control measures advised. See above.

## 2022-02-28 NOTE — Assessment & Plan Note (Signed)
Possible exacerbation; although FeNO is normal today. See above plan. She will continue Breztri for maintenance. She has trouble with jitteriness after using albuterol; we will trial change to Xopenex PRN to see if this helps. Continue singulair for trigger prevention.

## 2022-03-03 DIAGNOSIS — J4530 Mild persistent asthma, uncomplicated: Secondary | ICD-10-CM

## 2022-03-03 MED ORDER — LEVALBUTEROL TARTRATE 45 MCG/ACT IN AERO: 2.0000 | INHALATION_SPRAY | Freq: Four times a day (QID) | RESPIRATORY_TRACT | 2 refills | Status: DC | PRN

## 2022-03-13 DIAGNOSIS — F4320 Adjustment disorder, unspecified: Secondary | ICD-10-CM | POA: Diagnosis not present

## 2022-03-14 ENCOUNTER — Ambulatory Visit: Payer: BC Managed Care – PPO | Admitting: Nurse Practitioner

## 2022-03-25 DIAGNOSIS — F4320 Adjustment disorder, unspecified: Secondary | ICD-10-CM | POA: Diagnosis not present

## 2022-04-07 DIAGNOSIS — M75111 Incomplete rotator cuff tear or rupture of right shoulder, not specified as traumatic: Secondary | ICD-10-CM | POA: Diagnosis not present

## 2022-04-07 DIAGNOSIS — F4320 Adjustment disorder, unspecified: Secondary | ICD-10-CM | POA: Diagnosis not present

## 2022-04-07 DIAGNOSIS — G8929 Other chronic pain: Secondary | ICD-10-CM | POA: Diagnosis not present

## 2022-04-07 DIAGNOSIS — M25511 Pain in right shoulder: Secondary | ICD-10-CM | POA: Diagnosis not present

## 2022-04-10 DIAGNOSIS — E7800 Pure hypercholesterolemia, unspecified: Secondary | ICD-10-CM | POA: Diagnosis not present

## 2022-04-10 DIAGNOSIS — G43009 Migraine without aura, not intractable, without status migrainosus: Secondary | ICD-10-CM | POA: Diagnosis not present

## 2022-04-10 DIAGNOSIS — J4540 Moderate persistent asthma, uncomplicated: Secondary | ICD-10-CM | POA: Diagnosis not present

## 2022-04-17 ENCOUNTER — Other Ambulatory Visit: Payer: Self-pay | Admitting: Primary Care

## 2022-04-25 DIAGNOSIS — M7550 Bursitis of unspecified shoulder: Secondary | ICD-10-CM | POA: Diagnosis not present

## 2022-04-25 DIAGNOSIS — M75101 Unspecified rotator cuff tear or rupture of right shoulder, not specified as traumatic: Secondary | ICD-10-CM | POA: Diagnosis not present

## 2022-04-25 DIAGNOSIS — M7551 Bursitis of right shoulder: Secondary | ICD-10-CM | POA: Diagnosis not present

## 2022-04-25 DIAGNOSIS — M75111 Incomplete rotator cuff tear or rupture of right shoulder, not specified as traumatic: Secondary | ICD-10-CM | POA: Diagnosis not present

## 2022-04-25 DIAGNOSIS — G8918 Other acute postprocedural pain: Secondary | ICD-10-CM | POA: Diagnosis not present

## 2022-04-25 DIAGNOSIS — M85811 Other specified disorders of bone density and structure, right shoulder: Secondary | ICD-10-CM | POA: Diagnosis not present

## 2022-04-25 DIAGNOSIS — M7541 Impingement syndrome of right shoulder: Secondary | ICD-10-CM | POA: Diagnosis not present

## 2022-04-25 DIAGNOSIS — M94211 Chondromalacia, right shoulder: Secondary | ICD-10-CM | POA: Diagnosis not present

## 2022-04-25 DIAGNOSIS — M25511 Pain in right shoulder: Secondary | ICD-10-CM | POA: Diagnosis not present

## 2022-04-29 DIAGNOSIS — M25611 Stiffness of right shoulder, not elsewhere classified: Secondary | ICD-10-CM | POA: Diagnosis not present

## 2022-04-29 DIAGNOSIS — M25511 Pain in right shoulder: Secondary | ICD-10-CM | POA: Diagnosis not present

## 2022-05-05 DIAGNOSIS — M25611 Stiffness of right shoulder, not elsewhere classified: Secondary | ICD-10-CM | POA: Diagnosis not present

## 2022-05-05 DIAGNOSIS — M25511 Pain in right shoulder: Secondary | ICD-10-CM | POA: Diagnosis not present

## 2022-05-09 DIAGNOSIS — M25611 Stiffness of right shoulder, not elsewhere classified: Secondary | ICD-10-CM | POA: Diagnosis not present

## 2022-05-09 DIAGNOSIS — M25511 Pain in right shoulder: Secondary | ICD-10-CM | POA: Diagnosis not present

## 2022-05-13 DIAGNOSIS — M25511 Pain in right shoulder: Secondary | ICD-10-CM | POA: Diagnosis not present

## 2022-05-13 DIAGNOSIS — M25611 Stiffness of right shoulder, not elsewhere classified: Secondary | ICD-10-CM | POA: Diagnosis not present

## 2022-05-15 DIAGNOSIS — M25611 Stiffness of right shoulder, not elsewhere classified: Secondary | ICD-10-CM | POA: Diagnosis not present

## 2022-05-15 DIAGNOSIS — M25511 Pain in right shoulder: Secondary | ICD-10-CM | POA: Diagnosis not present

## 2022-05-19 DIAGNOSIS — M25511 Pain in right shoulder: Secondary | ICD-10-CM | POA: Diagnosis not present

## 2022-05-19 DIAGNOSIS — M25611 Stiffness of right shoulder, not elsewhere classified: Secondary | ICD-10-CM | POA: Diagnosis not present

## 2022-05-20 DIAGNOSIS — F4320 Adjustment disorder, unspecified: Secondary | ICD-10-CM | POA: Diagnosis not present

## 2022-05-22 DIAGNOSIS — M25611 Stiffness of right shoulder, not elsewhere classified: Secondary | ICD-10-CM | POA: Diagnosis not present

## 2022-05-22 DIAGNOSIS — M25511 Pain in right shoulder: Secondary | ICD-10-CM | POA: Diagnosis not present

## 2022-05-26 DIAGNOSIS — M25511 Pain in right shoulder: Secondary | ICD-10-CM | POA: Diagnosis not present

## 2022-05-26 DIAGNOSIS — M25611 Stiffness of right shoulder, not elsewhere classified: Secondary | ICD-10-CM | POA: Diagnosis not present

## 2022-05-29 DIAGNOSIS — M25511 Pain in right shoulder: Secondary | ICD-10-CM | POA: Diagnosis not present

## 2022-05-29 DIAGNOSIS — M25611 Stiffness of right shoulder, not elsewhere classified: Secondary | ICD-10-CM | POA: Diagnosis not present

## 2022-06-02 DIAGNOSIS — M25611 Stiffness of right shoulder, not elsewhere classified: Secondary | ICD-10-CM | POA: Diagnosis not present

## 2022-06-02 DIAGNOSIS — M25511 Pain in right shoulder: Secondary | ICD-10-CM | POA: Diagnosis not present

## 2022-06-03 DIAGNOSIS — F4320 Adjustment disorder, unspecified: Secondary | ICD-10-CM | POA: Diagnosis not present

## 2022-06-04 DIAGNOSIS — M25611 Stiffness of right shoulder, not elsewhere classified: Secondary | ICD-10-CM | POA: Diagnosis not present

## 2022-06-04 DIAGNOSIS — M25511 Pain in right shoulder: Secondary | ICD-10-CM | POA: Diagnosis not present

## 2022-06-09 DIAGNOSIS — M25611 Stiffness of right shoulder, not elsewhere classified: Secondary | ICD-10-CM | POA: Diagnosis not present

## 2022-06-09 DIAGNOSIS — M25511 Pain in right shoulder: Secondary | ICD-10-CM | POA: Diagnosis not present

## 2022-06-11 DIAGNOSIS — F4320 Adjustment disorder, unspecified: Secondary | ICD-10-CM | POA: Diagnosis not present

## 2022-06-12 DIAGNOSIS — M25611 Stiffness of right shoulder, not elsewhere classified: Secondary | ICD-10-CM | POA: Diagnosis not present

## 2022-06-12 DIAGNOSIS — M25511 Pain in right shoulder: Secondary | ICD-10-CM | POA: Diagnosis not present

## 2022-06-16 DIAGNOSIS — M25611 Stiffness of right shoulder, not elsewhere classified: Secondary | ICD-10-CM | POA: Diagnosis not present

## 2022-06-16 DIAGNOSIS — M25511 Pain in right shoulder: Secondary | ICD-10-CM | POA: Diagnosis not present

## 2022-06-17 DIAGNOSIS — F4320 Adjustment disorder, unspecified: Secondary | ICD-10-CM | POA: Diagnosis not present

## 2022-06-19 DIAGNOSIS — M25611 Stiffness of right shoulder, not elsewhere classified: Secondary | ICD-10-CM | POA: Diagnosis not present

## 2022-06-19 DIAGNOSIS — M25511 Pain in right shoulder: Secondary | ICD-10-CM | POA: Diagnosis not present

## 2022-06-23 DIAGNOSIS — M25611 Stiffness of right shoulder, not elsewhere classified: Secondary | ICD-10-CM | POA: Diagnosis not present

## 2022-06-23 DIAGNOSIS — M25511 Pain in right shoulder: Secondary | ICD-10-CM | POA: Diagnosis not present

## 2022-06-26 DIAGNOSIS — M25511 Pain in right shoulder: Secondary | ICD-10-CM | POA: Diagnosis not present

## 2022-06-26 DIAGNOSIS — M25611 Stiffness of right shoulder, not elsewhere classified: Secondary | ICD-10-CM | POA: Diagnosis not present

## 2022-07-01 DIAGNOSIS — M25511 Pain in right shoulder: Secondary | ICD-10-CM | POA: Diagnosis not present

## 2022-07-01 DIAGNOSIS — M25611 Stiffness of right shoulder, not elsewhere classified: Secondary | ICD-10-CM | POA: Diagnosis not present

## 2022-07-02 DIAGNOSIS — F4320 Adjustment disorder, unspecified: Secondary | ICD-10-CM | POA: Diagnosis not present

## 2022-07-09 DIAGNOSIS — F4320 Adjustment disorder, unspecified: Secondary | ICD-10-CM | POA: Diagnosis not present

## 2022-07-10 DIAGNOSIS — M25511 Pain in right shoulder: Secondary | ICD-10-CM | POA: Diagnosis not present

## 2022-07-10 DIAGNOSIS — M25611 Stiffness of right shoulder, not elsewhere classified: Secondary | ICD-10-CM | POA: Diagnosis not present

## 2022-07-15 DIAGNOSIS — F4320 Adjustment disorder, unspecified: Secondary | ICD-10-CM | POA: Diagnosis not present

## 2022-07-17 DIAGNOSIS — M25511 Pain in right shoulder: Secondary | ICD-10-CM | POA: Diagnosis not present

## 2022-07-17 DIAGNOSIS — M25611 Stiffness of right shoulder, not elsewhere classified: Secondary | ICD-10-CM | POA: Diagnosis not present

## 2022-07-22 DIAGNOSIS — M25611 Stiffness of right shoulder, not elsewhere classified: Secondary | ICD-10-CM | POA: Diagnosis not present

## 2022-07-22 DIAGNOSIS — M25511 Pain in right shoulder: Secondary | ICD-10-CM | POA: Diagnosis not present

## 2022-08-12 DIAGNOSIS — F4320 Adjustment disorder, unspecified: Secondary | ICD-10-CM | POA: Diagnosis not present

## 2022-08-18 ENCOUNTER — Other Ambulatory Visit: Payer: Self-pay | Admitting: Primary Care

## 2022-08-25 ENCOUNTER — Ambulatory Visit (INDEPENDENT_AMBULATORY_CARE_PROVIDER_SITE_OTHER): Payer: BC Managed Care – PPO | Admitting: Pulmonary Disease

## 2022-08-25 ENCOUNTER — Encounter: Payer: Self-pay | Admitting: Pulmonary Disease

## 2022-08-25 VITALS — BP 100/68 | HR 72 | Ht 65.0 in | Wt 138.0 lb

## 2022-08-25 DIAGNOSIS — J302 Other seasonal allergic rhinitis: Secondary | ICD-10-CM | POA: Diagnosis not present

## 2022-08-25 DIAGNOSIS — J4530 Mild persistent asthma, uncomplicated: Secondary | ICD-10-CM

## 2022-08-25 LAB — CBC WITH DIFFERENTIAL/PLATELET
Basophils Absolute: 0 10*3/uL (ref 0.0–0.1)
Basophils Relative: 0.7 % (ref 0.0–3.0)
Eosinophils Absolute: 0.1 10*3/uL (ref 0.0–0.7)
Eosinophils Relative: 1 % (ref 0.0–5.0)
HCT: 39.6 % (ref 36.0–46.0)
Hemoglobin: 13.5 g/dL (ref 12.0–15.0)
Lymphocytes Relative: 24.6 % (ref 12.0–46.0)
Lymphs Abs: 1.3 10*3/uL (ref 0.7–4.0)
MCHC: 34.2 g/dL (ref 30.0–36.0)
MCV: 91.2 fl (ref 78.0–100.0)
Monocytes Absolute: 0.4 10*3/uL (ref 0.1–1.0)
Monocytes Relative: 8.3 % (ref 3.0–12.0)
Neutro Abs: 3.4 10*3/uL (ref 1.4–7.7)
Neutrophils Relative %: 65.4 % (ref 43.0–77.0)
Platelets: 255 10*3/uL (ref 150.0–400.0)
RBC: 4.35 Mil/uL (ref 3.87–5.11)
RDW: 12.8 % (ref 11.5–15.5)
WBC: 5.2 10*3/uL (ref 4.0–10.5)

## 2022-08-25 MED ORDER — AMOXICILLIN-POT CLAVULANATE 875-125 MG PO TABS: 1.0000 | ORAL_TABLET | Freq: Two times a day (BID) | ORAL | 0 refills | Status: DC

## 2022-08-25 MED ORDER — PREDNISONE 20 MG PO TABS: 20.0000 mg | ORAL_TABLET | Freq: Every day | ORAL | 0 refills | Status: DC

## 2022-08-25 NOTE — Addendum Note (Signed)
Addended by: Lanna Poche on: 08/25/2022 11:01 AM   Modules accepted: Orders

## 2022-08-25 NOTE — Patient Instructions (Signed)
Prescription for Augmentin, prednisone  Blood work for allergy panel  Follow-up in 3 months  We can consider referral to allergist if symptoms remain uncontrolled  Continue inhalers, continue antihistamines

## 2022-08-25 NOTE — Progress Notes (Signed)
Brooke Haas    [id number]    26-Jul-1970  Primary Care Physician:Penner, Rinaldo Cloud, MD  Referring Physician: Alinda Deem, MD [address] [place],  [place] [address]  Chief complaint:   Patient with a history of asthma, allergies  HPI:  Was recently seen about November with exacerbation, treated with a course of antibiotics and steroids  Has had symptoms now for about 2 to 3 weeks with cough that is dry, feels stuffy in the head No fevers, no chills  Denies any wheezing  Symptoms usually get exacerbated and summer months  Had allergy testing done about 4 years ago that was negative  She is on Singulair, does use Flonase, uses antihistamines-Allegra  Never smoker No history of lung disease, no occupational exposure to predispose to lung disease  Does have pet dogs  She does not recollect been exposed to any new environment/agent that may have contributed to worsening symptoms  No exposure to birds, no recent travels  No gardening  Outpatient Encounter Medications as of 08/25/2022  Medication Sig   BREZTRI AEROSPHERE 160-9-4.8 MCG/ACT AERO 2 puffs BID   Cholecalciferol 100 MCG (4000 UT) CAPS Take by mouth.   estradiol (ESTRACE) 0.1 MG/GM vaginal cream Place vaginally as needed.   famotidine (PEPCID) 40 MG tablet TAKE 1 TABLET BY MOUTH EVERY EVENING   Fluticasone Propionate (FLONASE NA) Place into the nose.   levalbuterol (XOPENEX HFA) 45 MCG/ACT inhaler Inhale 2 puffs into the lungs every 6 (six) hours as needed for wheezing or shortness of breath.   montelukast (SINGULAIR) 10 MG tablet TAKE 1 TABLET BY MOUTH AT BEDTIME   Multiple Vitamin (MULTIVITAMIN) capsule Take 1 capsule by mouth daily.   naratriptan (AMERGE) 2.5 MG tablet Take 2.5 mg by mouth as needed for migraine. Take one (1) tablet at onset of headache; if returns or does not resolve, may repeat after 4 hours; do not exceed five (5) mg in 24 hours.   pantoprazole (PROTONIX)  40 MG tablet Take 40 mg by mouth daily.   rosuvastatin (CRESTOR) 10 MG tablet Take 10 mg by mouth daily.   topiramate (TOPAMAX) 50 MG tablet Take 50-100 mg by mouth at bedtime.   [DISCONTINUED] atorvastatin (LIPITOR) 10 MG tablet Take 10 mg by mouth daily after supper.   [DISCONTINUED] benzonatate (TESSALON) 200 MG capsule Take 1 capsule (200 mg total) by mouth 3 (three) times daily as needed for cough. (Patient not taking: Reported on 08/25/2022)   [DISCONTINUED] budesonide-formoterol (SYMBICORT) 160-4.5 MCG/ACT inhaler Inhale 2 puffs into the lungs 2 (two) times daily.   [DISCONTINUED] Cyanocobalamin (VITAMIN B 12 PO) Take by mouth.   [DISCONTINUED] omeprazole (PRILOSEC) 40 MG capsule Take 1 capsule (40 mg total) by mouth daily.   [DISCONTINUED] RABEprazole (ACIPHEX) 20 MG tablet Take 1 tablet (20 mg total) by mouth daily.   [DISCONTINUED] sucralfate (CARAFATE) 1 GM/10ML suspension Take 10 mLs (1 g total) by mouth 4 (four) times daily. Take 10 mLs ( 1 g total) by mouth 4 ( four) times daily for 2 weeks (Patient not taking: Reported on 08/25/2022)   No facility-administered encounter medications on file as of 08/25/2022.    Allergies as of 08/25/2022 - Review Complete 08/25/2022  Allergen Reaction Noted   Aleve [naproxen sodium] Anaphylaxis 11/25/2016    Past Medical History:  Diagnosis Date   Allergy    seasonal   Anemia    years ago prior to hysterectomy   Asthma  Breast mass, right    Breast mass, right    COVID-19 virus infection 06/16/2021   GERD (gastroesophageal reflux disease)    Headache    Hyperlipidemia     Past Surgical History:  Procedure Laterality Date   ABDOMINAL HYSTERECTOMY     BREAST BIOPSY Left    BREAST EXCISIONAL BIOPSY Right    CESAREAN SECTION     x 3   CHOLECYSTECTOMY     COLONOSCOPY     ESOPHAGOGASTRODUODENOSCOPY  12/11/2004   Mild gastritis. Otherwise normal EGD   RADIOACTIVE SEED GUIDED EXCISIONAL BREAST BIOPSY Right 12/01/2016   Procedure:  RIGHT RADIOACTIVE SEED GUIDED EXCISIONAL BREAST BIOPSY;  Surgeon: Emelia Loron, MD;  Location: [facility];  Service: General;  Laterality: Right;   ROTATOR CUFF REPAIR Right    SINOSCOPY     TONSILLECTOMY     UPPER GASTROINTESTINAL ENDOSCOPY      Family History  Problem Relation Age of Onset   Allergic rhinitis Mother    Breast cancer Mother 19   Allergic rhinitis Father    Allergic rhinitis Sister    Asthma Sister    Breast cancer Paternal Grandmother        45s   Colon cancer Neg Hx    Pancreatic cancer Neg Hx    Liver cancer Neg Hx    Esophageal cancer Neg Hx    Rectal cancer Neg Hx    Colon polyps Neg Hx    Stomach cancer Neg Hx     Social History   Socioeconomic History   Marital status: Married    Spouse name: Not on file   Number of children: Not on file   Years of education: Not on file   Highest education level: Not on file  Occupational History   Occupation: pharmacist    Comment: [employer]  Tobacco Use   Smoking status: Never   Smokeless tobacco: Never  Vaping Use   Vaping Use: Never used  Substance and Sexual Activity   Alcohol use: Yes    Comment: social   Drug use: No   Sexual activity: Not on file  Other Topics Concern   Not on file  Social History Narrative   Not on file   Social Determinants of Health   Financial Resource Strain: Not on file  Food Insecurity: Not on file  Transportation Needs: Not on file  Physical Activity: Not on file  Stress: Not on file  Social Connections: Not on file  Intimate Partner Violence: Not on file    Review of Systems  Constitutional: Negative.   HENT: Negative.    Respiratory:  Positive for shortness of breath.   Psychiatric/Behavioral: Negative.    All other systems reviewed and are negative.   Vitals:   08/25/22 0957  BP: 100/68  Pulse: 72  SpO2: 98%     Physical Exam Constitutional:      Appearance: She is well-developed.  HENT:     Head: Normocephalic  and atraumatic.  Eyes:     Pupils: Pupils are equal, round, and reactive to light.  Neck:     Thyroid: No thyromegaly.     Trachea: No tracheal deviation.  Cardiovascular:     Rate and Rhythm: Normal rate and regular rhythm.  Pulmonary:     Effort: Pulmonary effort is normal. No respiratory distress.     Breath sounds: Normal breath sounds. No stridor. No wheezing or rhonchi.  Musculoskeletal:     Cervical back: No rigidity or  tenderness.  Neurological:     Mental Status: She is alert and oriented to person, place, and time.  Psychiatric:        Mood and Affect: Mood normal.    Data Reviewed: CT scan from February -3mm nodule stable from previous, groundglass changes as reviewed by myself-reviewed with the patient  Assessment:  Shortness of breath -Exacerbation of symptoms  Rhinosinusitis -Will need a course of steroids and antibiotics  FeNo was normal the last time she had an exacerbation  Plan/Recommendations: Prescription for Augmentin sent to pharmacy  Prednisone 20 mg for 7 days  Schedule for allergy panel testing, CBC with differentials and IgE levels  Continue antihistamines  Avoid known triggers  Tentative follow-up in about 3 months  Encouraged to call with significant concerns   Virl Diamond MD [facility] 08/25/2022, 10:20 AM  CC: Alinda Deem, MD

## 2022-08-30 LAB — ALLERGEN PANEL (27) + IGE
Alternaria Alternata IgE: <0.1 kU/L
Aspergillus Fumigatus IgE: <0.1 kU/L
Bahia Grass IgE: <0.1 kU/L
Bermuda Grass IgE: <0.1 kU/L
Cat Dander IgE: <0.1 kU/L
Cedar, Mountain IgE: <0.1 kU/L
Cladosporium Herbarum IgE: <0.1 kU/L
Cocklebur IgE: <0.1 kU/L
Cockroach, American IgE: <0.1 kU/L
Common Silver Birch IgE: <0.1 kU/L
D Farinae IgE: <0.1 kU/L
D Pteronyssinus IgE: <0.1 kU/L
Dog Dander IgE: <0.1 kU/L
Elm, American IgE: <0.1 kU/L
Hickory, White IgE: <0.1 kU/L
IgE (Immunoglobulin E), Serum: 25 IU/mL (ref 6–495)
Johnson Grass IgE: <0.1 kU/L
Kentucky Bluegrass IgE: <0.1 kU/L
Maple/Box Elder IgE: <0.1 kU/L
Mucor Racemosus IgE: <0.1 kU/L
Oak, White IgE: <0.1 kU/L
Penicillium Chrysogen IgE: <0.1 kU/L
Pigweed, Rough IgE: <0.1 kU/L
Plantain, English IgE: <0.1 kU/L
Ragweed, Short IgE: <0.1 kU/L
Setomelanomma Rostrat: <0.1 kU/L
Timothy Grass IgE: <0.1 kU/L
White Mulberry IgE: <0.1 kU/L

## 2022-09-15 ENCOUNTER — Encounter: Payer: Self-pay | Admitting: Pulmonary Disease

## 2022-10-07 ENCOUNTER — Other Ambulatory Visit: Payer: Self-pay | Admitting: Obstetrics and Gynecology

## 2022-10-07 DIAGNOSIS — Z803 Family history of malignant neoplasm of breast: Secondary | ICD-10-CM

## 2022-10-07 DIAGNOSIS — Z9889 Other specified postprocedural states: Secondary | ICD-10-CM

## 2022-10-14 DIAGNOSIS — G8929 Other chronic pain: Secondary | ICD-10-CM | POA: Diagnosis not present

## 2022-10-14 DIAGNOSIS — M25511 Pain in right shoulder: Secondary | ICD-10-CM | POA: Diagnosis not present

## 2022-10-20 DIAGNOSIS — J4540 Moderate persistent asthma, uncomplicated: Secondary | ICD-10-CM | POA: Diagnosis not present

## 2022-10-20 DIAGNOSIS — E7800 Pure hypercholesterolemia, unspecified: Secondary | ICD-10-CM | POA: Diagnosis not present

## 2022-10-20 DIAGNOSIS — G43009 Migraine without aura, not intractable, without status migrainosus: Secondary | ICD-10-CM | POA: Diagnosis not present

## 2022-11-04 ENCOUNTER — Encounter: Payer: Self-pay | Admitting: Pulmonary Disease

## 2022-11-04 ENCOUNTER — Ambulatory Visit (INDEPENDENT_AMBULATORY_CARE_PROVIDER_SITE_OTHER): Payer: BC Managed Care – PPO | Admitting: Pulmonary Disease

## 2022-11-04 VITALS — BP 118/72 | HR 71 | Ht 65.0 in | Wt 139.2 lb

## 2022-11-04 DIAGNOSIS — J4530 Mild persistent asthma, uncomplicated: Secondary | ICD-10-CM | POA: Diagnosis not present

## 2022-11-04 NOTE — Patient Instructions (Signed)
I will see you about 6 months from here  We can try weaning down/off the [facility]  Make sure you have albuterol available to rescue if significant problems  If the sinus stuffiness/congestion were to turn into an infection, just send [place] a Message for course of antibiotics  Call us with significant concerns

## 2022-11-04 NOTE — Progress Notes (Signed)
Brooke Haas    [id number]    [birth year]  Primary Care Physician:Penner, Rinaldo Cloud, MD  Referring Physician: Alinda Deem, MD [address] [place],  [place] [address]  Chief complaint:   Patient with a history of asthma, allergies  HPI:  Was recently seen about November with exacerbation, treated with a course of antibiotics and steroids  Has been stable recently with no significant exacerbation of symptoms Hardly using rescue inhaler She continues to use Breztri  Since the last time she was here, they have gotten rid of all the carpeting in the house, this seems to have made a difference overall with her breathing  She is not having to stop herself from being active  Denies any wheezing  Symptoms usually get exacerbated and summer months  Had allergy testing done about 4 years ago that was negative  She is on Singulair, does use Flonase, uses antihistamines-Allegra  Never smoker No history of lung disease, no occupational exposure to predispose to lung disease  Does have pet dogs  She does not recollect been exposed to any new environment/agent that may have contributed to worsening symptoms  No exposure to birds, no recent travels  No gardening  Outpatient Encounter Medications as of 11/04/2022  Medication Sig   BREZTRI AEROSPHERE 160-9-4.8 MCG/ACT AERO 2 puffs BID   Cholecalciferol 100 MCG (4000 UT) CAPS Take by mouth.   estradiol (ESTRACE) 0.1 MG/GM vaginal cream Place vaginally as needed.   famotidine (PEPCID) 40 MG tablet TAKE 1 TABLET BY MOUTH EVERY EVENING   Fluticasone Propionate (FLONASE NA) Place into the nose.   levalbuterol (XOPENEX HFA) 45 MCG/ACT inhaler Inhale 2 puffs into the lungs every 6 (six) hours as needed for wheezing or shortness of breath.   montelukast (SINGULAIR) 10 MG tablet TAKE 1 TABLET BY MOUTH AT BEDTIME   Multiple Vitamin (MULTIVITAMIN) capsule Take 1 capsule by mouth daily.   naratriptan (AMERGE)  2.5 MG tablet Take 2.5 mg by mouth as needed for migraine. Take one (1) tablet at onset of headache; if returns or does not resolve, may repeat after 4 hours; do not exceed five (5) mg in 24 hours.   pantoprazole (PROTONIX) 40 MG tablet Take 40 mg by mouth daily.   topiramate (TOPAMAX) 50 MG tablet Take 50-100 mg by mouth at bedtime.   [DISCONTINUED] amoxicillin-clavulanate (AUGMENTIN) 875-125 MG tablet Take 1 tablet by mouth 2 (two) times daily.   [DISCONTINUED] predniSONE (DELTASONE) 20 MG tablet Take 1 tablet (20 mg total) by mouth daily with breakfast.   [DISCONTINUED] rosuvastatin (CRESTOR) 10 MG tablet Take 10 mg by mouth daily.   No facility-administered encounter medications on file as of 11/04/2022.    Allergies as of 11/04/2022 - Review Complete 11/04/2022  Allergen Reaction Noted   Aleve [naproxen sodium] Anaphylaxis 11/25/2016    Past Medical History:  Diagnosis Date   Allergy    seasonal   Anemia    years ago prior to hysterectomy   Asthma    Breast mass, right    Breast mass, right    COVID-19 virus infection 06/16/2021   GERD (gastroesophageal reflux disease)    Headache    Hyperlipidemia     Past Surgical History:  Procedure Laterality Date   ABDOMINAL HYSTERECTOMY     BREAST BIOPSY Left    BREAST EXCISIONAL BIOPSY Right    CESAREAN SECTION     x 3   CHOLECYSTECTOMY     COLONOSCOPY  ESOPHAGOGASTRODUODENOSCOPY  12/11/2004   Mild gastritis. Otherwise normal EGD   RADIOACTIVE SEED GUIDED EXCISIONAL BREAST BIOPSY Right 12/01/2016   Procedure: RIGHT RADIOACTIVE SEED GUIDED EXCISIONAL BREAST BIOPSY;  Surgeon: Emelia Loron, MD;  Location: [facility];  Service: General;  Laterality: Right;   ROTATOR CUFF REPAIR Right    SINOSCOPY     TONSILLECTOMY     UPPER GASTROINTESTINAL ENDOSCOPY      Family History  Problem Relation Age of Onset   Allergic rhinitis Mother    Breast cancer Mother 84   Allergic rhinitis Father    Allergic rhinitis  Sister    Asthma Sister    Breast cancer Paternal Grandmother        35s   Colon cancer Neg Hx    Pancreatic cancer Neg Hx    Liver cancer Neg Hx    Esophageal cancer Neg Hx    Rectal cancer Neg Hx    Colon polyps Neg Hx    Stomach cancer Neg Hx     Social History   Socioeconomic History   Marital status: Married    Spouse name: Not on file   Number of children: Not on file   Years of education: Not on file   Highest education level: Not on file  Occupational History   Occupation: pharmacist    Comment: [employer]  Tobacco Use   Smoking status: Never   Smokeless tobacco: Never  Vaping Use   Vaping Use: Never used  Substance and Sexual Activity   Alcohol use: Yes    Comment: social   Drug use: No   Sexual activity: Not on file  Other Topics Concern   Not on file  Social History Narrative   Not on file   Social Determinants of Health   Financial Resource Strain: Not on file  Food Insecurity: Not on file  Transportation Needs: Not on file  Physical Activity: Not on file  Stress: Not on file  Social Connections: Not on file  Intimate Partner Violence: Not on file    Review of Systems  Constitutional: Negative.   HENT: Negative.    Respiratory:  Positive for shortness of breath.   Psychiatric/Behavioral: Negative.    All other systems reviewed and are negative.   Vitals:   11/04/22 1042  BP: 118/72  Pulse: 71  SpO2: 98%     Physical Exam Constitutional:      Appearance: She is well-developed.  HENT:     Head: Normocephalic and atraumatic.     Mouth/Throat:     Mouth: Mucous membranes are moist.  Eyes:     Pupils: Pupils are equal, round, and reactive to light.  Neck:     Thyroid: No thyromegaly.     Trachea: No tracheal deviation.  Cardiovascular:     Rate and Rhythm: Normal rate and regular rhythm.  Pulmonary:     Effort: Pulmonary effort is normal. No respiratory distress.     Breath sounds: Normal breath sounds. No stridor. No  wheezing or rhonchi.  Musculoskeletal:     Cervical back: No rigidity or tenderness.  Neurological:     Mental Status: She is alert and oriented to person, place, and time.  Psychiatric:        Mood and Affect: Mood normal.    Data Reviewed: CT scan from February -3mm nodule stable from previous, groundglass changes as reviewed by myself-reviewed with the patient  Assessment:  Shortness of breath -Has been relatively stable since the  last visit -Changes made around the house is definitely helped symptoms  Rhinosinusitis -Symptoms are fully resolved  FeNo was normal the last time she had an exacerbation  Plan/Recommendations: Trial off of Markus Daft was discussed  Encouraged to make sure she has albuterol available all the time Continue antihistamines  Avoid known triggers  Will see again in about 6 months  Encouraged to give [place] a call if any concerns   Virl Diamond MD [facility] 11/04/2022, 10:51 AM  CC: Alinda Deem, MD

## 2022-11-05 DIAGNOSIS — L821 Other seborrheic keratosis: Secondary | ICD-10-CM | POA: Diagnosis not present

## 2022-11-05 DIAGNOSIS — L570 Actinic keratosis: Secondary | ICD-10-CM | POA: Diagnosis not present

## 2022-11-05 DIAGNOSIS — L814 Other melanin hyperpigmentation: Secondary | ICD-10-CM | POA: Diagnosis not present

## 2022-11-05 DIAGNOSIS — D225 Melanocytic nevi of trunk: Secondary | ICD-10-CM | POA: Diagnosis not present

## 2022-11-05 DIAGNOSIS — L578 Other skin changes due to chronic exposure to nonionizing radiation: Secondary | ICD-10-CM | POA: Diagnosis not present

## 2022-11-21 ENCOUNTER — Encounter: Payer: Self-pay | Admitting: Obstetrics and Gynecology

## 2022-11-24 ENCOUNTER — Ambulatory Visit
Admission: RE | Admit: 2022-11-24 | Discharge: 2022-11-24 | Disposition: A | Discharge status: Home / Self Care | Payer: BC Managed Care – PPO | Source: Ambulatory Visit | Attending: Obstetrics and Gynecology | Admitting: Obstetrics and Gynecology

## 2022-11-24 DIAGNOSIS — Z803 Family history of malignant neoplasm of breast: Secondary | ICD-10-CM

## 2022-11-24 DIAGNOSIS — R350 Frequency of micturition: Secondary | ICD-10-CM | POA: Diagnosis not present

## 2022-11-24 DIAGNOSIS — Z9889 Other specified postprocedural states: Secondary | ICD-10-CM

## 2022-11-24 DIAGNOSIS — Z1239 Encounter for other screening for malignant neoplasm of breast: Secondary | ICD-10-CM | POA: Diagnosis not present

## 2022-11-24 MED ORDER — GADOPICLENOL 0.5 MMOL/ML IV SOLN
7.0000 mL | Freq: Once | INTRAVENOUS | Status: AC | PRN
  Administered 2022-11-24: 7 mL via INTRAVENOUS

## 2022-11-25 DIAGNOSIS — R350 Frequency of micturition: Secondary | ICD-10-CM | POA: Diagnosis not present

## 2022-12-05 ENCOUNTER — Encounter: Payer: Self-pay | Admitting: Pulmonary Disease

## 2022-12-05 DIAGNOSIS — J4530 Mild persistent asthma, uncomplicated: Secondary | ICD-10-CM

## 2022-12-05 MED ORDER — LEVALBUTEROL TARTRATE 45 MCG/ACT IN AERO
2.0000 | INHALATION_SPRAY | Freq: Four times a day (QID) | RESPIRATORY_TRACT | 5 refills | Status: AC | PRN
Start: 2022-12-05 — End: 2025-03-21

## 2022-12-05 NOTE — Telephone Encounter (Signed)
Calling for levalbuterol [facility] HFA) 45 MCG/ACT inhaler [[id number]] refill

## 2023-01-08 ENCOUNTER — Other Ambulatory Visit: Payer: Self-pay | Admitting: Obstetrics and Gynecology

## 2023-01-08 DIAGNOSIS — Z1231 Encounter for screening mammogram for malignant neoplasm of breast: Secondary | ICD-10-CM

## 2023-01-29 ENCOUNTER — Ambulatory Visit
Admission: RE | Admit: 2023-01-29 | Discharge: 2023-01-29 | Disposition: A | Discharge status: Home / Self Care | Payer: BC Managed Care – PPO | Source: Ambulatory Visit | Attending: Obstetrics and Gynecology | Admitting: Obstetrics and Gynecology

## 2023-01-29 DIAGNOSIS — Z1231 Encounter for screening mammogram for malignant neoplasm of breast: Secondary | ICD-10-CM | POA: Diagnosis not present

## 2023-02-11 DIAGNOSIS — L300 Nummular dermatitis: Secondary | ICD-10-CM | POA: Diagnosis not present

## 2023-02-11 DIAGNOSIS — L570 Actinic keratosis: Secondary | ICD-10-CM | POA: Diagnosis not present

## 2023-02-11 DIAGNOSIS — Z01419 Encounter for gynecological examination (general) (routine) without abnormal findings: Secondary | ICD-10-CM | POA: Diagnosis not present

## 2023-02-20 ENCOUNTER — Other Ambulatory Visit: Payer: Self-pay | Admitting: Pulmonary Disease

## 2023-02-20 ENCOUNTER — Telehealth: Payer: Self-pay | Admitting: Pulmonary Disease

## 2023-02-20 ENCOUNTER — Encounter: Payer: Self-pay | Admitting: Pulmonary Disease

## 2023-02-20 MED ORDER — PREDNISONE 20 MG PO TABS: 40.0000 mg | ORAL_TABLET | Freq: Every day | ORAL | 0 refills | Status: AC

## 2023-02-20 MED ORDER — AZITHROMYCIN 250 MG PO TABS: ORAL_TABLET | ORAL | 0 refills | Status: DC

## 2023-02-20 NOTE — Telephone Encounter (Signed)
Patient states having shortness of breath and asthma. Would like Prednisone. Pharmacy is [facility]. No available appointments at this time. Patient phone number is [phone].

## 2023-02-20 NOTE — Telephone Encounter (Signed)
Called and spoke to patient.  She stated that she is experiencing an asthma flare.  C/o SOB with exertion and at night and non prod cough x4wk Denied f/c/s or additional sx.  She is using xopenex 3-6x daily and Breztri BID.  She would like prednisone.    Dr. Wynona Neat, please advise. Thanks

## 2023-02-20 NOTE — Progress Notes (Signed)
Prescription for prednisone and azithromycin sent in

## 2023-04-16 DIAGNOSIS — J4540 Moderate persistent asthma, uncomplicated: Secondary | ICD-10-CM | POA: Diagnosis not present

## 2023-04-16 DIAGNOSIS — G43009 Migraine without aura, not intractable, without status migrainosus: Secondary | ICD-10-CM | POA: Diagnosis not present

## 2023-04-16 DIAGNOSIS — E7800 Pure hypercholesterolemia, unspecified: Secondary | ICD-10-CM | POA: Diagnosis not present

## 2023-04-16 DIAGNOSIS — K219 Gastro-esophageal reflux disease without esophagitis: Secondary | ICD-10-CM | POA: Diagnosis not present

## 2023-04-16 DIAGNOSIS — E559 Vitamin D deficiency, unspecified: Secondary | ICD-10-CM | POA: Diagnosis not present

## 2023-05-11 ENCOUNTER — Ambulatory Visit: Payer: BC Managed Care – PPO | Admitting: Pulmonary Disease

## 2023-05-11 ENCOUNTER — Encounter: Payer: Self-pay | Admitting: Pulmonary Disease

## 2023-05-11 VITALS — BP 112/64 | HR 70 | Temp 97.9°F | Ht 65.0 in | Wt 139.8 lb

## 2023-05-11 DIAGNOSIS — J4530 Mild persistent asthma, uncomplicated: Secondary | ICD-10-CM | POA: Diagnosis not present

## 2023-05-11 NOTE — Patient Instructions (Signed)
 I will see you about a year from now  Continue using Breztri as tolerated  Use Xopenex as needed  At some point still try to wean off Breztri if you can -Let us know if you need refills  Call us with significant concerns

## 2023-05-11 NOTE — Progress Notes (Signed)
 Brooke Haas    [id number]    Jan 19, 1971  Primary Care Physician:Penner, Sharlet, MD  Referring Physician: Gayl Sharlet, MD [address] [place],  [place] [address]  Chief complaint:   Patient with a history of asthma, allergies  HPI:  Treated for an exacerbation recently  Uses Breztri   Has been doing relatively well Rescue inhaler as needed  Does use Xopenex   Since the last time she was here, they have gotten rid of all the carpeting in the house, this seems to have made a difference overall with her breathing  Denies any wheezing  Symptoms usually get exacerbated and summer months  Had allergy testing done about 4 years ago that was negative  She is on Singulair , does use Flonase, uses antihistamines-Allegra  Never smoker No history of lung disease, no occupational exposure to predispose to lung disease  Does have pet dogs  She does not recollect been exposed to any new environment/agent that may have contributed to worsening symptoms  No exposure to birds, no recent travels  No gardening  Outpatient Encounter Medications as of 05/11/2023  Medication Sig   BREZTRI  AEROSPHERE 160-9-4.8 MCG/ACT AERO 2 puffs BID   Cholecalciferol 100 MCG (4000 UT) CAPS Take by mouth.   estradiol (ESTRACE) 0.1 MG/GM vaginal cream Place vaginally as needed.   famotidine  (PEPCID ) 40 MG tablet TAKE 1 TABLET BY MOUTH EVERY EVENING   Fluticasone Propionate (FLONASE NA) Place into the nose.   levalbuterol  (XOPENEX  HFA) 45 MCG/ACT inhaler Inhale 2 puffs into the lungs every 6 (six) hours as needed for wheezing or shortness of breath.   montelukast  (SINGULAIR ) 10 MG tablet TAKE 1 TABLET BY MOUTH AT BEDTIME   Multiple Vitamin (MULTIVITAMIN) capsule Take 1 capsule by mouth daily.   naratriptan (AMERGE) 2.5 MG tablet Take 2.5 mg by mouth as needed for migraine. Take one (1) tablet at onset of headache; if returns or does not resolve, may repeat after 4  hours; do not exceed five (5) mg in 24 hours.   pantoprazole  (PROTONIX ) 40 MG tablet Take 40 mg by mouth daily.   rosuvastatin (CRESTOR) 5 MG tablet Take 5 mg by mouth daily.   topiramate  (TOPAMAX ) 50 MG tablet Take 50-100 mg by mouth at bedtime.   [DISCONTINUED] azithromycin  (ZITHROMAX  Z-PAK) 250 MG tablet Take 2 tablets day 1 and then 1 daily for 4 days (Patient not taking: Reported on 05/11/2023)   No facility-administered encounter medications on file as of 05/11/2023.    Allergies as of 05/11/2023 - Review Complete 05/11/2023  Allergen Reaction Noted   Aleve [naproxen sodium] Anaphylaxis 11/25/2016    Past Medical History:  Diagnosis Date   Allergy    seasonal   Anemia    years ago prior to hysterectomy   Asthma    Breast mass, right    Breast mass, right    COVID-19 virus infection 06/16/2021   GERD (gastroesophageal reflux disease)    Headache    Hyperlipidemia     Past Surgical History:  Procedure Laterality Date   ABDOMINAL HYSTERECTOMY     BREAST BIOPSY Left    BREAST EXCISIONAL BIOPSY Right    CESAREAN SECTION     x 3   CHOLECYSTECTOMY     COLONOSCOPY     ESOPHAGOGASTRODUODENOSCOPY  12/11/2004   Mild gastritis. Otherwise normal EGD   RADIOACTIVE SEED GUIDED EXCISIONAL BREAST BIOPSY Right 12/01/2016   Procedure: RIGHT RADIOACTIVE SEED GUIDED EXCISIONAL BREAST BIOPSY;  Surgeon: Ebbie Cough, MD;  Location: [facility];  Service: General;  Laterality: Right;   ROTATOR CUFF REPAIR Right    SINOSCOPY     TONSILLECTOMY     UPPER GASTROINTESTINAL ENDOSCOPY      Family History  Problem Relation Age of Onset   Allergic rhinitis Mother    Breast cancer Mother 60   Allergic rhinitis Father    Allergic rhinitis Sister    Asthma Sister    Breast cancer Paternal Grandmother        27s   Colon cancer Neg Hx    Pancreatic cancer Neg Hx    Liver cancer Neg Hx    Esophageal cancer Neg Hx    Rectal cancer Neg Hx    Colon polyps Neg Hx     Stomach cancer Neg Hx     Social History   Socioeconomic History   Marital status: Married    Spouse name: Not on file   Number of children: Not on file   Years of education: Not on file   Highest education level: Not on file  Occupational History   Occupation: pharmacist    Comment: [employer]  Tobacco Use   Smoking status: Never   Smokeless tobacco: Never  Vaping Use   Vaping status: Never Used  Substance and Sexual Activity   Alcohol  use: Yes    Comment: social   Drug use: No   Sexual activity: Not on file  Other Topics Concern   Not on file  Social History Narrative   Not on file   Social Drivers of Health   Financial Resource Strain: Not on file  Food Insecurity: Not on file  Transportation Needs: Not on file  Physical Activity: Not on file  Stress: Not on file  Social Connections: Not on file  Intimate Partner Violence: Not on file    Review of Systems  Constitutional: Negative.   HENT: Negative.    Respiratory:  Positive for shortness of breath.   Psychiatric/Behavioral: Negative.    All other systems reviewed and are negative.   Vitals:   05/11/23 1008  BP: 112/64  Pulse: 70  Temp: 97.9 F (36.6 C)  SpO2: 99%     Physical Exam Constitutional:      Appearance: She is well-developed.  HENT:     Head: Normocephalic and atraumatic.     Mouth/Throat:     Mouth: Mucous membranes are moist.  Eyes:     Pupils: Pupils are equal, round, and reactive to light.  Neck:     Thyroid : No thyromegaly.     Trachea: No tracheal deviation.  Cardiovascular:     Rate and Rhythm: Normal rate and regular rhythm.  Pulmonary:     Effort: Pulmonary effort is normal. No respiratory distress.     Breath sounds: Normal breath sounds. No stridor. No wheezing or rhonchi.  Musculoskeletal:     Cervical back: No rigidity or tenderness.  Neurological:     Mental Status: She is alert and oriented to person, place, and time.  Psychiatric:        Mood and  Affect: Mood normal.    Data Reviewed: CT scan from February -3mm nodule stable from previous, groundglass changes as reviewed by myself-reviewed with the patient  Assessment:  Shortness of breath -Has been relatively stable since the last visit -Changes made around the house is definitely helped symptoms  Rhinosinusitis -Symptoms are fully resolved  FeNo was normal the last time she  had an exacerbation  Plan/Recommendations: Unable to stop using Breztri   However symptoms are relatively stable  Avoid known triggers  Avoid known triggers  Follow-up in about a year   Encouraged to give us  a call if any concerns   Jennet Epley MD [facility] 05/11/2023, 10:25 AM  CC: Gayl Males, MD

## 2023-07-09 DIAGNOSIS — Z0000 Encounter for general adult medical examination without abnormal findings: Secondary | ICD-10-CM | POA: Diagnosis not present

## 2023-07-09 DIAGNOSIS — Z90710 Acquired absence of both cervix and uterus: Secondary | ICD-10-CM | POA: Diagnosis not present

## 2023-07-09 DIAGNOSIS — Z6823 Body mass index (BMI) 23.0-23.9, adult: Secondary | ICD-10-CM | POA: Diagnosis not present

## 2023-07-09 DIAGNOSIS — M25552 Pain in left hip: Secondary | ICD-10-CM | POA: Diagnosis not present

## 2023-07-10 LAB — LAB REPORT - SCANNED

## 2023-07-13 ENCOUNTER — Emergency Department (HOSPITAL_BASED_OUTPATIENT_CLINIC_OR_DEPARTMENT_OTHER)
Admission: EM | Admit: 2023-07-13 | Discharge: 2023-07-14 | Disposition: A | Discharge status: Home / Self Care | Attending: Emergency Medicine | Admitting: Emergency Medicine

## 2023-07-13 ENCOUNTER — Emergency Department (HOSPITAL_BASED_OUTPATIENT_CLINIC_OR_DEPARTMENT_OTHER)

## 2023-07-13 ENCOUNTER — Encounter (HOSPITAL_BASED_OUTPATIENT_CLINIC_OR_DEPARTMENT_OTHER): Payer: Self-pay

## 2023-07-13 ENCOUNTER — Other Ambulatory Visit: Payer: Self-pay

## 2023-07-13 DIAGNOSIS — R1013 Epigastric pain: Secondary | ICD-10-CM | POA: Insufficient documentation

## 2023-07-13 DIAGNOSIS — R1011 Right upper quadrant pain: Secondary | ICD-10-CM | POA: Diagnosis not present

## 2023-07-13 DIAGNOSIS — R7401 Elevation of levels of liver transaminase levels: Secondary | ICD-10-CM | POA: Diagnosis not present

## 2023-07-13 DIAGNOSIS — I959 Hypotension, unspecified: Secondary | ICD-10-CM

## 2023-07-13 DIAGNOSIS — K769 Liver disease, unspecified: Secondary | ICD-10-CM | POA: Diagnosis not present

## 2023-07-13 DIAGNOSIS — R1031 Right lower quadrant pain: Secondary | ICD-10-CM | POA: Diagnosis not present

## 2023-07-13 DIAGNOSIS — R109 Unspecified abdominal pain: Secondary | ICD-10-CM | POA: Diagnosis not present

## 2023-07-13 LAB — COMPREHENSIVE METABOLIC PANEL
ALT: 115 U/L — ABNORMAL HIGH (ref 0–44)
AST: 274 U/L — ABNORMAL HIGH (ref 15–41)
Albumin: 4.6 g/dL (ref 3.5–5.0)
Alkaline Phosphatase: 79 U/L (ref 38–126)
Anion gap: 7 (ref 5–15)
BUN: 11 mg/dL (ref 6–20)
CO2: 26 mmol/L (ref 22–32)
Calcium: 9.5 mg/dL (ref 8.9–10.3)
Chloride: 109 mmol/L (ref 98–111)
Creatinine, Ser: 0.69 mg/dL (ref 0.44–1.00)
GFR, Estimated: >60 mL/min (ref >60)
Glucose, Bld: 108 mg/dL — ABNORMAL HIGH (ref 70–99)
Potassium: 4.2 mmol/L (ref 3.5–5.1)
Sodium: 142 mmol/L (ref 135–145)
Total Bilirubin: 0.5 mg/dL (ref 0.0–1.2)
Total Protein: 7.1 g/dL (ref 6.5–8.1)

## 2023-07-13 LAB — CBC WITH DIFFERENTIAL/PLATELET
Abs Immature Granulocytes: 0 10*3/uL (ref 0.00–0.07)
Basophils Absolute: 0.1 10*3/uL (ref 0.0–0.1)
Basophils Relative: 1 %
Eosinophils Absolute: 0.1 10*3/uL (ref 0.0–0.5)
Eosinophils Relative: 1 %
HCT: 43.5 % (ref 36.0–46.0)
Hemoglobin: 14.4 g/dL (ref 12.0–15.0)
Immature Granulocytes: 0 %
Lymphocytes Relative: 21 %
Lymphs Abs: 1 10*3/uL (ref 0.7–4.0)
MCH: 30.4 pg (ref 26.0–34.0)
MCHC: 33.1 g/dL (ref 30.0–36.0)
MCV: 92 fL (ref 80.0–100.0)
Monocytes Absolute: 0.5 10*3/uL (ref 0.1–1.0)
Monocytes Relative: 10 %
Neutro Abs: 3.1 10*3/uL (ref 1.7–7.7)
Neutrophils Relative %: 67 %
Platelets: 263 10*3/uL (ref 150–400)
RBC: 4.73 MIL/uL (ref 3.87–5.11)
RDW: 12.1 % (ref 11.5–15.5)
WBC: 4.6 10*3/uL (ref 4.0–10.5)
nRBC: 0 % (ref 0.0–0.2)

## 2023-07-13 LAB — URINALYSIS, W/ REFLEX TO CULTURE (INFECTION SUSPECTED)
Bacteria, UA: NONE SEEN
Bilirubin Urine: NEGATIVE
Glucose, UA: NEGATIVE mg/dL
Hgb urine dipstick: NEGATIVE
Ketones, ur: NEGATIVE mg/dL
Leukocytes,Ua: NEGATIVE
Nitrite: NEGATIVE
Protein, ur: NEGATIVE mg/dL
Specific Gravity, Urine: 1.039 — ABNORMAL HIGH (ref 1.005–1.030)
pH: 6.5 (ref 5.0–8.0)

## 2023-07-13 LAB — TSH: TSH: 1.06 u[IU]/mL (ref 0.350–4.500)

## 2023-07-13 LAB — LACTIC ACID, PLASMA
Lactic Acid, Venous: 0.6 mmol/L (ref 0.5–1.9)
Lactic Acid, Venous: 0.8 mmol/L (ref 0.5–1.9)

## 2023-07-13 LAB — TROPONIN I (HIGH SENSITIVITY)
Troponin I (High Sensitivity): 4 ng/L (ref <18)
Troponin I (High Sensitivity): 5 ng/L (ref <18)

## 2023-07-13 LAB — LIPASE, BLOOD: Lipase: 29 U/L (ref 11–51)

## 2023-07-13 MED ORDER — MORPHINE SULFATE (PF) 4 MG/ML IV SOLN
4.0000 mg | Freq: Once | INTRAVENOUS | Status: AC
  Administered 2023-07-13: 4 mg via INTRAVENOUS
  Filled 2023-07-13: qty 1

## 2023-07-13 MED ORDER — PROCHLORPERAZINE EDISYLATE 10 MG/2ML IJ SOLN
10.0000 mg | Freq: Once | INTRAMUSCULAR | Status: AC
  Administered 2023-07-13: 10 mg via INTRAVENOUS
  Filled 2023-07-13: qty 2

## 2023-07-13 MED ORDER — ACETAMINOPHEN 500 MG PO TABS
1000.0000 mg | ORAL_TABLET | Freq: Once | ORAL | Status: AC
  Administered 2023-07-13: 1000 mg via ORAL
  Filled 2023-07-13: qty 2

## 2023-07-13 MED ORDER — ONDANSETRON 4 MG PO TBDP
4.0000 mg | ORAL_TABLET | Freq: Once | ORAL | Status: AC
  Administered 2023-07-13: 4 mg via ORAL
  Filled 2023-07-13: qty 1

## 2023-07-13 MED ORDER — HYDROMORPHONE HCL 1 MG/ML IJ SOLN
0.5000 mg | Freq: Once | INTRAMUSCULAR | Status: AC
  Administered 2023-07-13: 0.5 mg via INTRAVENOUS
  Filled 2023-07-13: qty 1

## 2023-07-13 MED ORDER — SODIUM CHLORIDE 0.9 % IV BOLUS
1000.0000 mL | Freq: Once | INTRAVENOUS | Status: AC
  Administered 2023-07-13: 1000 mL via INTRAVENOUS

## 2023-07-13 MED ORDER — DIPHENHYDRAMINE HCL 50 MG/ML IJ SOLN
25.0000 mg | Freq: Once | INTRAMUSCULAR | Status: AC
  Administered 2023-07-13: 25 mg via INTRAVENOUS
  Filled 2023-07-13: qty 1

## 2023-07-13 MED ORDER — LACTATED RINGERS IV BOLUS
1000.0000 mL | Freq: Once | INTRAVENOUS | Status: AC
  Administered 2023-07-13: 1000 mL via INTRAVENOUS

## 2023-07-13 MED ORDER — IOHEXOL 300 MG/ML  SOLN
100.0000 mL | Freq: Once | INTRAMUSCULAR | Status: AC | PRN
  Administered 2023-07-13: 75 mL via INTRAVENOUS

## 2023-07-13 MED ORDER — METOCLOPRAMIDE HCL 5 MG/ML IJ SOLN
10.0000 mg | Freq: Once | INTRAMUSCULAR | Status: AC
  Administered 2023-07-13: 10 mg via INTRAVENOUS
  Filled 2023-07-13: qty 2

## 2023-07-13 MED ORDER — PANTOPRAZOLE SODIUM 40 MG IV SOLR
40.0000 mg | Freq: Once | INTRAVENOUS | Status: AC
  Administered 2023-07-13: 40 mg via INTRAVENOUS
  Filled 2023-07-13: qty 10

## 2023-07-13 MED ORDER — IOHEXOL 350 MG/ML SOLN
100.0000 mL | Freq: Once | INTRAVENOUS | Status: AC | PRN
  Administered 2023-07-13: 75 mL via INTRAVENOUS

## 2023-07-13 NOTE — ED Provider Notes (Incomplete)
  Physical Exam  BP (!) 97/55   Pulse (!) 58   Temp 98.5 F (36.9 C)   Resp 16   SpO2 96%   Physical Exam  Procedures  Procedures  ED Course / MDM    Medical Decision Making Amount and/or Complexity of Data Reviewed Labs: ordered. Radiology: ordered.  Risk OTC drugs. Prescription drug management.   ***

## 2023-07-13 NOTE — ED Triage Notes (Signed)
 Pt c/o RUQ pain onset Friday evening, worsening since. Associated NV. Advises 2 enemas, miralax, thinking it was constipation, but no relief of pain. Hx cholecystectomy, "but it feels like the same kind of pain."

## 2023-07-13 NOTE — ED Provider Notes (Signed)
 [place] EMERGENCY DEPARTMENT AT [facility] Provider Note   CSN: [id number] Arrival date & time: 07/13/23  1037     History  Chief Complaint  Patient presents with   Abdominal Pain    RUQ    Brooke Haas is a 53 y.o. female.  Patient with history of previous cholecystectomy, cesarean section presents to the emergency department for evaluation of right upper quadrant abdominal pain.  This started 3 nights ago, but was more mild.  It felt like previous gallbladder pain.  Patient feels pain in the right upper quadrant that it hurts worse when she takes a deep breath, radiates to her right arm and shoulder.  It worsened yesterday.  Patient tried medication for constipation including MiraLAX and an enema.  This helped her produce stool, but the pain did not get better.  She denies associated vomiting but has had significant nausea and limited appetite.  She tried home antacids and her usual Prilosec and famotidine without improvement.  She states that she had lab work as part of routine physical 3 days ago.  She called and reports her lab work was negative.  She does have history of intermittently elevated transaminases before.  Follows with [facility]. Treated for GERD. Endoscopy largely negative 01/2022. Over past year SBP typically 100-118.        Home Medications Prior to Admission medications   Medication Sig Start Date End Date Taking? Authorizing Provider  BREZTRI AEROSPHERE 160-9-4.8 MCG/ACT AERO 2 puffs BID 12/31/21   [provider]  Cholecalciferol 100 MCG (4000 UT) CAPS Take by mouth.    [provider]  estradiol (ESTRACE) 0.1 MG/GM vaginal cream Place vaginally as needed. 01/04/20   [provider]  famotidine (PEPCID) 40 MG tablet TAKE 1 TABLET BY MOUTH EVERY EVENING 05/22/21   Kozlow, Alvira Philips, MD  Fluticasone Propionate (FLONASE NA) Place into the nose.    [provider]  levalbuterol Pauline Aus HFA) 45 MCG/ACT inhaler  Inhale 2 puffs into the lungs every 6 (six) hours as needed for wheezing or shortness of breath. 12/05/22 12/05/23  Tomma Lightning, MD  montelukast (SINGULAIR) 10 MG tablet TAKE 1 TABLET BY MOUTH AT BEDTIME 08/18/22   Glenford Bayley, NP  Multiple Vitamin (MULTIVITAMIN) capsule Take 1 capsule by mouth daily.    [provider]  naratriptan (AMERGE) 2.5 MG tablet Take 2.5 mg by mouth as needed for migraine. Take one (1) tablet at onset of headache; if returns or does not resolve, may repeat after 4 hours; do not exceed five (5) mg in 24 hours.    [provider]  pantoprazole (PROTONIX) 40 MG tablet Take 40 mg by mouth daily. 12/23/21   [provider]  rosuvastatin (CRESTOR) 5 MG tablet Take 5 mg by mouth daily.    [provider]  topiramate (TOPAMAX) 50 MG tablet Take 50-100 mg by mouth at bedtime. 10/17/21   [provider]      Allergies    Aleve [naproxen sodium]    Review of Systems   Review of Systems  Physical Exam Updated Vital Signs BP 111/73 (BP Location: Right Arm)   Pulse 74   Temp 97.9 F (36.6 C) (Oral)   Resp 18   SpO2 100%  Physical Exam Vitals and nursing note reviewed.  Constitutional:      General: She is in acute distress.     Appearance: She is well-developed.     Comments: Patient appears uncomfortable  HENT:  Head: Normocephalic and atraumatic.     Right Ear: External ear normal.     Left Ear: External ear normal.     Nose: Nose normal.  Eyes:     Conjunctiva/sclera: Conjunctivae normal.  Cardiovascular:     Rate and Rhythm: Normal rate and regular rhythm.     Heart sounds: No murmur heard. Pulmonary:     Effort: No respiratory distress.     Breath sounds: No wheezing, rhonchi or rales.  Abdominal:     Palpations: Abdomen is soft.     Tenderness: There is abdominal tenderness in the right upper quadrant and epigastric area. There is no guarding or rebound. Negative signs include Murphy's sign and  McBurney's sign.  Musculoskeletal:     Cervical back: Normal range of motion and neck supple.     Right lower leg: No edema.     Left lower leg: No edema.  Skin:    General: Skin is warm and dry.     Findings: No rash.  Neurological:     General: No focal deficit present.     Mental Status: She is alert. Mental status is at baseline.     Motor: No weakness.  Psychiatric:        Mood and Affect: Mood normal.    ED Results / Procedures / Treatments   Labs (all labs ordered are listed, but only abnormal results are displayed) Labs Reviewed  COMPREHENSIVE METABOLIC PANEL - Abnormal; Notable for the following components:      Result Value   Glucose, Bld 108 (*)    AST 274 (*)    ALT 115 (*)    All other components within normal limits  CBC WITH DIFFERENTIAL/PLATELET  LIPASE, BLOOD  URINALYSIS, W/ REFLEX TO CULTURE (INFECTION SUSPECTED)  LACTIC ACID, PLASMA  LACTIC ACID, PLASMA  TROPONIN I (HIGH SENSITIVITY)    EKG EKG Interpretation Date/Time:  Monday July 13 2023 14:40:22 EDT Ventricular Rate:  79 PR Interval:  153 QRS Duration:  95 QT Interval:  416 QTC Calculation: 477 R Axis:   53  Text Interpretation: Sinus rhythm Confirmed by Virgina Norfolk [phone]) on 07/13/2023 2:41:59 PM  Radiology CT ABDOMEN PELVIS W CONTRAST Result Date: 07/13/2023 CLINICAL DATA:  Abdominal pain. EXAM: CT ABDOMEN AND PELVIS WITH CONTRAST TECHNIQUE: Multidetector CT imaging of the abdomen and pelvis was performed using the standard protocol following bolus administration of intravenous contrast. RADIATION DOSE REDUCTION: This exam was performed according to the departmental dose-optimization program which includes automated exposure control, adjustment of the mA and/or kV according to patient size and/or use of iterative reconstruction technique. CONTRAST:  75mL OMNIPAQUE IOHEXOL 300 MG/ML  SOLN COMPARISON:  CT abdomen pelvis dated 11/12/2014. FINDINGS: Lower chest: The visualized lung bases are  clear. No intra-abdominal free air or free fluid. Hepatobiliary: Interval increase in the size of the cystic lesion along the posterior liver. The liver is otherwise unremarkable. Cholecystectomy. Pancreas: Unremarkable. No pancreatic ductal dilatation or surrounding inflammatory changes. Spleen: Normal in size without focal abnormality. Adrenals/Urinary Tract: The adrenal glands are unremarkable. There is no hydronephrosis on either side. There is symmetric enhancement and excretion of contrast by both kidneys. The visualized ureters and urinary bladder appear unremarkable. Stomach/Bowel: There is no bowel obstruction or active inflammation. The appendix is not visualized with certainty. No inflammatory changes identified in the right lower quadrant. Vascular/Lymphatic: The abdominal aorta and IVC are unremarkable. No portal venous gas. There is no adenopathy. Reproductive: Hysterectomy.  No suspicious adnexal masses. Other: None  Musculoskeletal: No acute or significant osseous findings. IMPRESSION: 1. No acute intra-abdominal or pelvic pathology. 2. Interval increase in the size of the cystic lesion along the posterior liver. Electronically Signed   By: Elgie Collard M.D.   On: 07/13/2023 14:01    Procedures Procedures    Medications Ordered in ED Medications  pantoprazole (PROTONIX) injection 40 mg (has no administration in time range)  ondansetron (ZOFRAN-ODT) disintegrating tablet 4 mg (4 mg Oral Given 07/13/23 1101)  HYDROmorphone (DILAUDID) injection 0.5 mg (0.5 mg Intravenous Given 07/13/23 1155)  sodium chloride 0.9 % bolus 1,000 mL (0 mLs Intravenous Stopped 07/13/23 1413)  iohexol (OMNIPAQUE) 300 MG/ML solution 100 mL (75 mLs Intravenous Contrast Given 07/13/23 1207)  HYDROmorphone (DILAUDID) injection 0.5 mg (0.5 mg Intravenous Given 07/13/23 1321)  metoCLOPramide (REGLAN) injection 10 mg (10 mg Intravenous Given 07/13/23 1330)  morphine (PF) 4 MG/ML injection 4 mg (4 mg Intravenous Given  07/13/23 1330)  sodium chloride 0.9 % bolus 1,000 mL (1,000 mLs Intravenous New Bag/Given 07/13/23 1410)    ED Course/ Medical Decision Making/ A&P    Patient seen and examined. History obtained directly from patient.   Labs/EKG: Personally reviewed and interpreted Labs ordered in triage including CBC which was unremarkable; CMP with elevated transaminases AST 274, ALT 115, normal total bilirubin; lipase normal.  Imaging: Ordered CT abdomen pelvis.  Medications/Fluids: Ordered: Dilaudid, Zofran, fluid bolus.   Most recent vital signs reviewed and are as follows: BP 111/73 (BP Location: Right Arm)   Pulse 74   Temp 97.9 F (36.6 C) (Oral)   Resp 18   SpO2 100%   Initial impression: Right upper quadrant abdominal pain with elevated liver enzymes, no fever.  1:26 PM Pain remains uncontrolled after 2 doses 0.5mg  dilaudid. CT read pending. IV reglan, IV morphine ordered. She has allergy to NSAIDs.   CT personally reviewed, she does have known posterior liver mass/cyst since prior to 2016. Awaiting radiology report.   1:37 PM Reassessment performed. Patient appears uncomfortable.  She reports pain controlled until returning from CT.  Patient updated on delay in obtaining CT reads.  Reviewed pertinent lab work and imaging with patient at bedside. Questions answered.   Most current vital signs reviewed and are as follows: BP 111/73 (BP Location: Right Arm)   Pulse 74   Temp 97.9 F (36.6 C) (Oral)   Resp 18   SpO2 100%   Plan: Continue attempts at pain control.  2:47 PM Reassessment performed. Patient appears more comfortable. Discussed with Dr. Lockie Mola.  Labs personally reviewed and interpreted including: EKG unremarkable  Imaging: Right upper quadrant ultrasound  Reviewed pertinent lab work and imaging with patient at bedside. Questions answered.   Most current vital signs reviewed and are as follows: BP (!) 91/51   Pulse 77   Temp 97.9 F (36.6 C) (Oral)   Resp 12    SpO2 100%   Plan: IV fluids for low blood pressure after morphine, IV protonix dose, right upper quadrant ultrasound, reassessment  4:42 PM  Imaging results reviewed: Patient portal vein, liver lesion again noted   Reviewed pertinent lab work and imaging with patient at bedside. Questions answered.   Most current vital signs reviewed and are as follows: BP (!) 88/58   Pulse 76   Temp (!) 97.5 F (36.4 C) (Temporal)   Resp 15   SpO2 96%   5:04 PM Orthostatics performed. First reading was low and then improved with standing. Pt did not feel dizzy or lightheaded per  RN. Given abnormal vitals, asked Dr. Dalene Seltzer to see. Pt reported is feeling better and wants to go home.   Orthostatic VS for the past 24 hrs:  BP- Lying Pulse- Lying BP- Sitting Pulse- Sitting BP- Standing at 0 minutes Pulse- Standing at 0 minutes  07/13/23 1655 (!) 79/40 84 (!) 88/55 85 99/58 81   5:51 PM Patient seen by Dr. Dalene Seltzer, plan for PE evaluation and UA.   BP currently 100/43.   6:48 PM Dr. Dalene Seltzer to follow-up on results and reassess.                                  Medical Decision Making Amount and/or Complexity of Data Reviewed Labs: ordered. Radiology: ordered.  Risk Prescription drug management.   For this patient's complaint of abdominal pain, the following conditions were considered on the differential diagnosis: gastritis/PUD, enteritis/duodenitis, appendicitis, cholelithiasis/cholecystitis, cholangitis, pancreatitis, ruptured viscus, colitis, diverticulitis, small/large bowel obstruction, proctitis, cystitis, pyelonephritis, ureteral colic, aortic dissection, aortic aneurysm. In women, ectopic pregnancy, pelvic inflammatory disease, ovarian cysts, and tubo-ovarian abscess were also considered. Atypical chest etiologies were also considered including ACS, PE, and pneumonia.  Unclear etiology of symptoms at this point. Pain earlier out of proportion to what has been found so far.  She  has also been hypotensive, initially after opioids for pain control.  Currently awaiting additional imaging.  Patient has a liver mass which is slightly larger than previous, but aware of for about 20 years.  This is assumed to be stable to this point.  No evidence of bleeding or fluid around this area.  No signs of portal vein thrombosis on ultrasound.  Unclear if this is contributing to current symptoms.        Final Clinical Impression(s) / ED Diagnoses Final diagnoses:  RUQ abdominal pain  Hypotension, unspecified hypotension type  Elevated transaminase level    Rx / DC Orders ED Discharge Orders     None         Renne Crigler, PA-C 07/13/23 1850    Alvira Monday, MD 07/14/23 1220

## 2023-07-13 NOTE — Discharge Instructions (Addendum)
 Please have your liver function tests rechecked in a week. Your AST was high at 274 and your ALT was high at 115.

## 2023-07-14 LAB — T4, FREE: Free T4: 0.85 ng/dL (ref 0.61–1.12)

## 2023-07-14 LAB — CORTISOL: Cortisol, Plasma: 13.7 ug/dL

## 2023-07-21 DIAGNOSIS — R945 Abnormal results of liver function studies: Secondary | ICD-10-CM | POA: Diagnosis not present

## 2023-07-21 DIAGNOSIS — R932 Abnormal findings on diagnostic imaging of liver and biliary tract: Secondary | ICD-10-CM | POA: Diagnosis not present

## 2023-07-21 DIAGNOSIS — R1011 Right upper quadrant pain: Secondary | ICD-10-CM | POA: Diagnosis not present

## 2023-07-29 ENCOUNTER — Telehealth: Payer: Self-pay | Admitting: Gastroenterology

## 2023-07-29 NOTE — Telephone Encounter (Signed)
 Inbound call from patient stating that she has an appointment with Dr. Chales Abrahams on 4/23 at 8:30 and is wanting to see if she can get in sooner. Patient states that she is having abdominal pain and is having issues with nausea. Patient states she has been seen in the ER and has been seen with her PCP and was given Bentyl and nausea medication and is still unsure what is causing the pain and nausea.  Patient is requesting a call to discuss, please advise.

## 2023-07-29 NOTE — Telephone Encounter (Signed)
 Pt requesting sooner appointment. Pt was rescheduled to 07/30/2023 at 10:00. Pt made aware.  Pt verbalized understanding with all questions answered.

## 2023-07-30 ENCOUNTER — Ambulatory Visit (INDEPENDENT_AMBULATORY_CARE_PROVIDER_SITE_OTHER): Admitting: Gastroenterology

## 2023-07-30 ENCOUNTER — Emergency Department (HOSPITAL_COMMUNITY)

## 2023-07-30 ENCOUNTER — Encounter: Payer: Self-pay | Admitting: Gastroenterology

## 2023-07-30 ENCOUNTER — Emergency Department (HOSPITAL_COMMUNITY)
Admission: EM | Admit: 2023-07-30 | Discharge: 2023-07-30 | Disposition: A | Discharge status: Home / Self Care | Attending: Emergency Medicine | Admitting: Emergency Medicine

## 2023-07-30 VITALS — BP 110/80 | HR 92 | Ht 64.0 in | Wt 136.2 lb

## 2023-07-30 DIAGNOSIS — K7689 Other specified diseases of liver: Secondary | ICD-10-CM

## 2023-07-30 DIAGNOSIS — R1110 Vomiting, unspecified: Secondary | ICD-10-CM | POA: Diagnosis not present

## 2023-07-30 DIAGNOSIS — K8020 Calculus of gallbladder without cholecystitis without obstruction: Secondary | ICD-10-CM | POA: Diagnosis not present

## 2023-07-30 DIAGNOSIS — N3289 Other specified disorders of bladder: Secondary | ICD-10-CM | POA: Diagnosis not present

## 2023-07-30 DIAGNOSIS — K5900 Constipation, unspecified: Secondary | ICD-10-CM

## 2023-07-30 DIAGNOSIS — R1011 Right upper quadrant pain: Secondary | ICD-10-CM | POA: Diagnosis not present

## 2023-07-30 DIAGNOSIS — R7989 Other specified abnormal findings of blood chemistry: Secondary | ICD-10-CM | POA: Diagnosis not present

## 2023-07-30 DIAGNOSIS — R109 Unspecified abdominal pain: Secondary | ICD-10-CM | POA: Diagnosis not present

## 2023-07-30 DIAGNOSIS — R1010 Upper abdominal pain, unspecified: Secondary | ICD-10-CM | POA: Diagnosis not present

## 2023-07-30 DIAGNOSIS — Z90710 Acquired absence of both cervix and uterus: Secondary | ICD-10-CM | POA: Diagnosis not present

## 2023-07-30 DIAGNOSIS — Z9049 Acquired absence of other specified parts of digestive tract: Secondary | ICD-10-CM | POA: Diagnosis not present

## 2023-07-30 LAB — DIFFERENTIAL
Abs Immature Granulocytes: 0.01 10*3/uL (ref 0.00–0.07)
Basophils Absolute: 0.1 10*3/uL (ref 0.0–0.1)
Basophils Relative: 1 %
Eosinophils Absolute: 0.1 10*3/uL (ref 0.0–0.5)
Eosinophils Relative: 2 %
Immature Granulocytes: 0 %
Lymphocytes Relative: 20 %
Lymphs Abs: 1.3 10*3/uL (ref 0.7–4.0)
Monocytes Absolute: 0.6 10*3/uL (ref 0.1–1.0)
Monocytes Relative: 9 %
Neutro Abs: 4.3 10*3/uL (ref 1.7–7.7)
Neutrophils Relative %: 67 %
nRBC: 0 /100{WBCs}

## 2023-07-30 LAB — COMPREHENSIVE METABOLIC PANEL WITH GFR
ALT: 50 U/L — ABNORMAL HIGH (ref 0–44)
AST: 38 U/L (ref 15–41)
Albumin: 4.3 g/dL (ref 3.5–5.0)
Alkaline Phosphatase: 92 U/L (ref 38–126)
Anion gap: 10 (ref 5–15)
BUN: 22 mg/dL — ABNORMAL HIGH (ref 6–20)
CO2: 22 mmol/L (ref 22–32)
Calcium: 9.3 mg/dL (ref 8.9–10.3)
Chloride: 109 mmol/L (ref 98–111)
Creatinine, Ser: 0.58 mg/dL (ref 0.44–1.00)
GFR, Estimated: >60 mL/min (ref >60)
Glucose, Bld: 98 mg/dL (ref 70–99)
Potassium: 4 mmol/L (ref 3.5–5.1)
Sodium: 141 mmol/L (ref 135–145)
Total Bilirubin: 0.7 mg/dL (ref 0.0–1.2)
Total Protein: 7.6 g/dL (ref 6.5–8.1)

## 2023-07-30 LAB — LIPASE, BLOOD: Lipase: 43 U/L (ref 11–51)

## 2023-07-30 LAB — CBC
HCT: 43.2 % (ref 36.0–46.0)
Hemoglobin: 13.8 g/dL (ref 12.0–15.0)
MCH: 30.6 pg (ref 26.0–34.0)
MCHC: 31.9 g/dL (ref 30.0–36.0)
MCV: 95.8 fL (ref 80.0–100.0)
Platelets: 340 10*3/uL (ref 150–400)
RBC: 4.51 MIL/uL (ref 3.87–5.11)
RDW: 12.1 % (ref 11.5–15.5)
WBC: 6.2 10*3/uL (ref 4.0–10.5)
nRBC: 0 % (ref 0.0–0.2)

## 2023-07-30 LAB — URINALYSIS, ROUTINE W REFLEX MICROSCOPIC
Bilirubin Urine: NEGATIVE
Glucose, UA: NEGATIVE mg/dL
Hgb urine dipstick: NEGATIVE
Ketones, ur: NEGATIVE mg/dL
Leukocytes,Ua: NEGATIVE
Nitrite: NEGATIVE
Protein, ur: NEGATIVE mg/dL
Specific Gravity, Urine: 1.013 (ref 1.005–1.030)
pH: 7 (ref 5.0–8.0)

## 2023-07-30 MED ORDER — ONDANSETRON HCL 4 MG/2ML IJ SOLN
4.0000 mg | Freq: Once | INTRAMUSCULAR | Status: AC
  Administered 2023-07-30: 4 mg via INTRAVENOUS
  Filled 2023-07-30: qty 2

## 2023-07-30 MED ORDER — SODIUM CHLORIDE 0.9 % IV BOLUS
500.0000 mL | Freq: Once | INTRAVENOUS | Status: AC
  Administered 2023-07-30: 500 mL via INTRAVENOUS

## 2023-07-30 MED ORDER — SODIUM CHLORIDE 0.9 % IV BOLUS
1000.0000 mL | Freq: Once | INTRAVENOUS | Status: AC
  Administered 2023-07-30: 1000 mL via INTRAVENOUS

## 2023-07-30 MED ORDER — GADOBUTROL 1 MMOL/ML IV SOLN
6.0000 mL | Freq: Once | INTRAVENOUS | Status: AC | PRN
Start: 2023-07-30 — End: 2023-07-30
  Administered 2023-07-30: 6 mL via INTRAVENOUS

## 2023-07-30 MED ORDER — IOHEXOL 300 MG/ML  SOLN
100.0000 mL | Freq: Once | INTRAMUSCULAR | Status: AC | PRN
  Administered 2023-07-30: 100 mL via INTRAVENOUS

## 2023-07-30 NOTE — ED Provider Notes (Signed)
 [place] EMERGENCY DEPARTMENT AT [facility] Provider Note   CSN: [id number] Arrival date & time: 07/30/23  1032     History  Chief Complaint  Patient presents with   Abdominal Pain   Emesis    Brooke Haas is a 52 y.o. female.  Patient with a history of a cholecystectomy.  She has been having abdominal pain with some elevated liver enzymes.  She was seen by GI who sent her over here for an MRCP.  The history is provided by the patient and medical records. No language interpreter was used.  Abdominal Pain Pain location:  Epigastric Pain quality: aching   Pain radiates to:  Does not radiate Pain severity:  Moderate Onset quality:  Sudden Timing:  Constant Progression:  Worsening Chronicity:  New Context: not alcohol use   Relieved by:  Nothing Associated symptoms: vomiting   Associated symptoms: no chest pain, no cough, no diarrhea, no fatigue and no hematuria   Emesis Associated symptoms: abdominal pain   Associated symptoms: no cough, no diarrhea and no headaches        Home Medications Prior to Admission medications   Medication Sig Start Date End Date Taking? Authorizing Provider  BREZTRI AEROSPHERE 160-9-4.8 MCG/ACT AERO 2 puffs BID 12/31/21   [provider]  Cholecalciferol 100 MCG (4000 UT) CAPS Take by mouth.    [provider]  dicyclomine (BENTYL) 20 MG tablet Take 20 mg by mouth 4 (four) times daily. 07/21/23   [provider]  estradiol (ESTRACE) 0.1 MG/GM vaginal cream Place vaginally as needed. 01/04/20   [provider]  famotidine (PEPCID) 40 MG tablet TAKE 1 TABLET BY MOUTH EVERY EVENING 05/22/21   Kozlow, Alvira Philips, MD  Fluticasone Propionate (FLONASE NA) Place into the nose.    [provider]  levalbuterol Pauline Aus HFA) 45 MCG/ACT inhaler Inhale 2 puffs into the lungs every 6 (six) hours as needed for wheezing or shortness of breath. 12/05/22 12/05/23  Tomma Lightning, MD  montelukast  (SINGULAIR) 10 MG tablet TAKE 1 TABLET BY MOUTH AT BEDTIME 08/18/22   Glenford Bayley, NP  Multiple Vitamin (MULTIVITAMIN) capsule Take 1 capsule by mouth daily.    [provider]  naratriptan (AMERGE) 2.5 MG tablet Take 2.5 mg by mouth as needed for migraine. Take one (1) tablet at onset of headache; if returns or does not resolve, may repeat after 4 hours; do not exceed five (5) mg in 24 hours.    [provider]  ondansetron (ZOFRAN-ODT) 4 MG disintegrating tablet Take 2 mg by mouth every 6 (six) hours as needed. 07/21/23   [provider]  pantoprazole (PROTONIX) 40 MG tablet Take 40 mg by mouth daily. 12/23/21   [provider]  rosuvastatin (CRESTOR) 5 MG tablet Take 5 mg by mouth daily.    [provider]  topiramate (TOPAMAX) 50 MG tablet Take 50-100 mg by mouth at bedtime. 10/17/21   [provider]      Allergies    Aleve [naproxen sodium]    Review of Systems   Review of Systems  Constitutional:  Negative for appetite change and fatigue.  HENT:  Negative for congestion, ear discharge and sinus pressure.   Eyes:  Negative for discharge.  Respiratory:  Negative for cough.   Cardiovascular:  Negative for chest pain.  Gastrointestinal:  Positive for abdominal pain and vomiting. Negative for diarrhea.  Genitourinary:  Negative for frequency and hematuria.  Musculoskeletal:  Negative for back  pain.  Skin:  Negative for rash.  Neurological:  Negative for seizures and headaches.  Psychiatric/Behavioral:  Negative for hallucinations.     Physical Exam Updated Vital Signs BP (!) 100/59 (BP Location: Left Arm)   Pulse 77   Temp 98.2 F (36.8 C) (Oral)   Resp 18   SpO2 100%  Physical Exam Vitals and nursing note reviewed.  Constitutional:      Appearance: She is well-developed.  HENT:     Head: Normocephalic.     Nose: Nose normal.  Eyes:     General: No scleral icterus.    Conjunctiva/sclera: Conjunctivae normal.   Neck:     Thyroid: No thyromegaly.  Cardiovascular:     Rate and Rhythm: Normal rate and regular rhythm.     Heart sounds: No murmur heard.    No friction rub. No gallop.  Pulmonary:     Breath sounds: No stridor. No wheezing or rales.  Chest:     Chest wall: No tenderness.  Abdominal:     General: There is no distension.     Tenderness: There is abdominal tenderness. There is no rebound.  Musculoskeletal:        General: Normal range of motion.     Cervical back: Neck supple.  Lymphadenopathy:     Cervical: No cervical adenopathy.  Skin:    Findings: No erythema or rash.  Neurological:     Mental Status: She is alert and oriented to person, place, and time.     Motor: No abnormal muscle tone.     Coordination: Coordination normal.  Psychiatric:        Behavior: Behavior normal.     ED Results / Procedures / Treatments   Labs (all labs ordered are listed, but only abnormal results are displayed) Labs Reviewed  COMPREHENSIVE METABOLIC PANEL WITH GFR - Abnormal; Notable for the following components:      Result Value   BUN 22 (*)    ALT 50 (*)    All other components within normal limits  LIPASE, BLOOD  CBC  URINALYSIS, ROUTINE W REFLEX MICROSCOPIC  DIFFERENTIAL    EKG None  Radiology MR ABDOMEN MRCP W WO CONTAST Result Date: 07/30/2023 CLINICAL DATA:  Cholelithiasis; Cholelithiasis 644753 Abdominal pain 644753. EXAM: MRI ABDOMEN WITHOUT AND WITH CONTRAST (INCLUDING MRCP) TECHNIQUE: Multiplanar multisequence MR imaging of the abdomen was performed both before and after the administration of intravenous contrast. Heavily T2-weighted images of the biliary and pancreatic ducts were obtained, and three-dimensional MRCP images were rendered by post processing. CONTRAST:  6mL GADAVIST GADOBUTROL 1 MMOL/ML IV SOLN COMPARISON:  CT scan abdomen and pelvis from earlier the same day and 07/13/2023. FINDINGS: Lower chest: Unremarkable MR appearance to the lung bases. No pleural  effusion. No pericardial effusion. Normal heart size. Hepatobiliary: The liver is normal in size and configuration. Noncirrhotic configuration. Redemonstration of a bilobed nonenhancing mildly complex/proteinaceous partially exophytic cyst arising from the right hepatic lobe, segments 6/7 without significant interval change since the prior study from 07/13/2023. No intrahepatic or extrahepatic bile duct dilatation. No choledocholithiasis. Status post cholecystectomy. Pancreas: No mass, inflammatory changes or other parenchymal abnormality identified. No main pancreatic duct dilation. Spleen:  Within normal limits in size and appearance. No focal mass. Adrenals/Urinary Tract: Unremarkable adrenal glands. No hydroureteronephrosis. No suspicious renal mass. Stomach/Bowel: Visualized portions within the abdomen are unremarkable. No disproportionate dilation of bowel loops. Vascular/Lymphatic: No pathologically enlarged lymph nodes identified. No abdominal aortic aneurysm demonstrated. No ascites. Other:  None. Musculoskeletal:  No suspicious bone lesions identified. IMPRESSION: 1. No acute inflammatory process identified within the abdomen. 2. Status post cholecystectomy. No intra or extrahepatic bile duct dilation. 3. Redemonstration of bilobed mildly complex nonenhancing cyst arising from the right hepatic dome. Electronically Signed   By: Jules Schick M.D.   On: 07/30/2023 14:17   MR 3D Recon At Scanner Result Date: 07/30/2023 CLINICAL DATA:  Cholelithiasis; Cholelithiasis 644753 Abdominal pain 644753. EXAM: MRI ABDOMEN WITHOUT AND WITH CONTRAST (INCLUDING MRCP) TECHNIQUE: Multiplanar multisequence MR imaging of the abdomen was performed both before and after the administration of intravenous contrast. Heavily T2-weighted images of the biliary and pancreatic ducts were obtained, and three-dimensional MRCP images were rendered by post processing. CONTRAST:  6mL GADAVIST GADOBUTROL 1 MMOL/ML IV SOLN COMPARISON:  CT  scan abdomen and pelvis from earlier the same day and 07/13/2023. FINDINGS: Lower chest: Unremarkable MR appearance to the lung bases. No pleural effusion. No pericardial effusion. Normal heart size. Hepatobiliary: The liver is normal in size and configuration. Noncirrhotic configuration. Redemonstration of a bilobed nonenhancing mildly complex/proteinaceous partially exophytic cyst arising from the right hepatic lobe, segments 6/7 without significant interval change since the prior study from 07/13/2023. No intrahepatic or extrahepatic bile duct dilatation. No choledocholithiasis. Status post cholecystectomy. Pancreas: No mass, inflammatory changes or other parenchymal abnormality identified. No main pancreatic duct dilation. Spleen:  Within normal limits in size and appearance. No focal mass. Adrenals/Urinary Tract: Unremarkable adrenal glands. No hydroureteronephrosis. No suspicious renal mass. Stomach/Bowel: Visualized portions within the abdomen are unremarkable. No disproportionate dilation of bowel loops. Vascular/Lymphatic: No pathologically enlarged lymph nodes identified. No abdominal aortic aneurysm demonstrated. No ascites. Other:  None. Musculoskeletal: No suspicious bone lesions identified. IMPRESSION: 1. No acute inflammatory process identified within the abdomen. 2. Status post cholecystectomy. No intra or extrahepatic bile duct dilation. 3. Redemonstration of bilobed mildly complex nonenhancing cyst arising from the right hepatic dome. Electronically Signed   By: Jules Schick M.D.   On: 07/30/2023 14:17   CT ABDOMEN PELVIS W CONTRAST Result Date: 07/30/2023 CLINICAL DATA:  Abdominal pain, acute, nonlocalized. EXAM: CT ABDOMEN AND PELVIS WITH CONTRAST TECHNIQUE: Multidetector CT imaging of the abdomen and pelvis was performed using the standard protocol following bolus administration of intravenous contrast. RADIATION DOSE REDUCTION: This exam was performed according to the departmental  dose-optimization program which includes automated exposure control, adjustment of the mA and/or kV according to patient size and/or use of iterative reconstruction technique. CONTRAST:  OMNIPAQUE IOHEXOL 300 MG/ML  SOLN COMPARISON:  CT scan abdomen and pelvis from 07/13/2023. FINDINGS: Lower chest: The lung bases are clear. No pleural effusion. The heart is normal in size. No pericardial effusion. Hepatobiliary: The liver is normal in size. Non-cirrhotic configuration. There is mild diffuse hepatic steatosis. Redemonstration of a partially exophytic hypoattenuating homogeneous structure arising from the right hepatic dome, segments 6/7, which exhibit few peripheral calcifications. No surrounding fat stranding. No intrahepatic or extrahepatic bile duct dilation. Gallbladder is surgically absent. Pancreas: Unremarkable. No pancreatic ductal dilatation or surrounding inflammatory changes. Spleen: Within normal limits. No focal lesion. Adrenals/Urinary Tract: Adrenal glands are unremarkable. No suspicious renal mass. No hydronephrosis. No renal or ureteric calculi. Urinary bladder is under distended, precluding optimal assessment. However, no large mass or stones identified. No perivesical fat stranding. Stomach/Bowel: No disproportionate dilation of the small or large bowel loops. No evidence of abnormal bowel wall thickening or inflammatory changes. The appendix was not visualized; however there is no acute inflammatory process in the right lower quadrant. Vascular/Lymphatic:  No ascites or pneumoperitoneum. No abdominal or pelvic lymphadenopathy, by size criteria. No aneurysmal dilation of the major abdominal arteries. Reproductive: The uterus is surgically absent. No large adnexal mass. Other: There is a tiny fat containing umbilical hernia. The soft tissues and abdominal wall are otherwise unremarkable. Musculoskeletal: No suspicious osseous lesions. There are mild multilevel degenerative changes in the  visualized spine. IMPRESSION: 1. No acute inflammatory process identified within the abdomen or pelvis. 2. Multiple other nonacute observations, as described above. Electronically Signed   By: Jules Schick M.D.   On: 07/30/2023 13:51    Procedures Procedures    Medications Ordered in ED Medications  ondansetron (ZOFRAN) injection 4 mg (4 mg Intravenous Given 07/30/23 1106)  sodium chloride 0.9 % bolus 500 mL (0 mLs Intravenous Stopped 07/30/23 1202)  sodium chloride 0.9 % bolus 1,000 mL (0 mLs Intravenous Stopped 07/30/23 1202)  iohexol (OMNIPAQUE) 300 MG/ML solution 100 mL (100 mLs Intravenous Contrast Given 07/30/23 1254)  gadobutrol (GADAVIST) 1 MMOL/ML injection 6 mL (6 mLs Intravenous Contrast Given 07/30/23 1337)    ED Course/ Medical Decision Making/ A&P                                 Medical Decision Making Amount and/or Complexity of Data Reviewed Labs: ordered. Radiology: ordered.  Risk Prescription drug management.  MRI was unremarkable except for cyst on her liver Patient with abdominal pain.  She will increase her Protonix to twice a day and follow-up with Dr. Chales Abrahams        Final Clinical Impression(s) / ED Diagnoses Final diagnoses:  Pain of upper abdomen    Rx / DC Orders ED Discharge Orders     None         Bethann Berkshire, MD 08/02/23 1101

## 2023-07-30 NOTE — Telephone Encounter (Signed)
 Patient was seen at [facility] ED today & had both a CT AP and MRCP completed. Results are available in epic. Per patient, there were no stones, but ED provider suggested her liver cyst could be causing her pain and to follow up with GI. Advised patient I will let Dr. Chales Abrahams reviewed scans & advise on further follow ups, and then our office will be back in touch with her.

## 2023-07-30 NOTE — Patient Instructions (Addendum)
 _______________________________________________________  If your blood pressure at your visit was 140/90 or greater, please contact your primary care physician to follow up on this.  _______________________________________________________  If you are age 53 or older, your body mass index should be between 23-30. Your Body mass index is 23.39 kg/m. If this is out of the aforementioned range listed, please consider follow up with your Primary Care Provider.  If you are age 27 or younger, your body mass index should be between 19-25. Your Body mass index is 23.39 kg/m. If this is out of the aformentioned range listed, please consider follow up with your Primary Care Provider.   ________________________________________________________  The [place] [facility] providers would like to encourage you to use [facility] to communicate with providers for non-urgent requests or questions.  Due to long hold times on the telephone, sending your provider a message by [facility] may be a faster and more efficient way to get a response.  Please allow 48 business hours for a response.  Please remember that this is for non-urgent requests.  _______________________________________________________  Please go to the ED  Thank you,  Dr. Lynann Bologna

## 2023-07-30 NOTE — ED Triage Notes (Signed)
 Pt c/o RUQ abd pain intermittently since being seen in the ED on 3/17. Pt had f/u appointment today and had worsening pain in the area and nausea without vomiting. Pt referred to ED for possible MRCP.

## 2023-07-30 NOTE — ED Notes (Signed)
 Patient transported to MRI

## 2023-07-30 NOTE — Discharge Instructions (Signed)
 Increase your Protonix so you are taking it twice a day and follow-up with Dr. Chales Abrahams in the next few weeks

## 2023-07-30 NOTE — Telephone Encounter (Addendum)
 Patient requesting to schedule f/u with Dr. Chales Abrahams, patient was offered his soonest availability in June, however patient would like sooner appointment. Please advise.

## 2023-07-30 NOTE — Progress Notes (Addendum)
 Chief Complaint: For GI evaluation  Referring Provider:  Alinda Deem, MD      ASSESSMENT AND PLAN;   #1. RUQ abdo pain. S/P lap chole in past. But with elevated LFTs during pain. Neg [place], CT, CTA 3/17 except for enlarging liver cyst. ? Retained CBD stone, liver cyst rupture vs infection.  #2. Constipation   Plan: -Rpt CT Abdo -MRCP stat -ED at [facility] stat for pain control, labs (CBC, CMP, lipase)   ED at [facility] called. 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  She experiences severe right-sided abdominal pain and nausea, similar to her previous gallbladder symptoms. The pain began on July 13, 2023, after eating pizza, and was severe enough to prevent deep breathing without radiating pain. It radiates to her back and arm, accompanied by nausea. She initially waited through the weekend, hoping for improvement, but sought emergency care on July 13, 2023.  In the emergency department 3/17, a CT scan and ultrasound showed no acute findings except enlarging liver cyst, but liver function tests revealed elevated transaminases. A liver cyst was noted to have increased in size. Her liver function tests were normal earlier on 3/14 the pain started.  3/14: AST:36, ALT 34, Nl TB 3/17: AST 274, ALT: 115  No jaundice, dark urine, itching.  She has a history of constipation, more pronounced since the fall. She uses MiraLAX, requiring a double dose for effectiveness, and has used enemas in the past. Despite these measures, she continues to experience significant abdominal discomfort and nausea, particularly when driving or after eating certain foods.  No fever or chills. The pain is persistent and has not fully resolved, despite dietary modifications and laxative use.        1. Asthma, moderate persistent, well-controlled   2. Perennial allergic rhinitis   3. LPRD (laryngopharyngeal reflux disease)     SH- Pharmacist.  4 children (3 older ones in college), husband Kathlene November.  LSCS x 3 (younger 1 is adopted)  Past GI work-up: EGD 11/2004: Mild gastritis. Neg CLO, neg SB Bx  Laparoscopic cholecystectomy 09/2003 with negative IOC.  Dr. Georgiana Shore  CT Abdo/pelvis 10/2004 stable cystic lesion posterior to right lobe of the liver.  Stable since 03/2004.   Past Medical History:  Diagnosis Date   Allergy    seasonal   Anemia    years ago prior to hysterectomy   Asthma    Breast mass, right    Breast mass, right    COVID-19 virus infection 06/16/2021   GERD (gastroesophageal reflux disease)    Headache    Hyperlipidemia     Past Surgical History:  Procedure Laterality Date   ABDOMINAL HYSTERECTOMY     BREAST BIOPSY Left    BREAST EXCISIONAL BIOPSY Right    CESAREAN SECTION     x 3   CHOLECYSTECTOMY     COLONOSCOPY     ESOPHAGOGASTRODUODENOSCOPY  12/11/2004   Mild gastritis. Otherwise normal EGD   RADIOACTIVE SEED GUIDED EXCISIONAL BREAST BIOPSY Right 12/01/2016   Procedure: RIGHT RADIOACTIVE SEED GUIDED EXCISIONAL BREAST BIOPSY;  Surgeon: Emelia Loron, MD;  Location: [facility];  Service: General;  Laterality: Right;   ROTATOR CUFF REPAIR Right    SINOSCOPY     TONSILLECTOMY  UPPER GASTROINTESTINAL ENDOSCOPY      Family History  Problem Relation Age of Onset   Allergic rhinitis Mother    Breast cancer Mother 41   Allergic rhinitis Father    Allergic rhinitis Sister    Asthma Sister    Breast cancer Paternal Grandmother        9s   Colon cancer Neg Hx    Pancreatic cancer Neg Hx    Liver cancer Neg Hx    Esophageal cancer Neg Hx    Rectal cancer Neg Hx    Colon polyps Neg Hx    Stomach cancer Neg Hx     Social History   Tobacco Use   Smoking status: Never   Smokeless tobacco: Never  Vaping Use    Vaping status: Never Used  Substance Use Topics   Alcohol use: Yes    Comment: social   Drug use: No    Current Outpatient Medications  Medication Sig Dispense Refill   BREZTRI AEROSPHERE 160-9-4.8 MCG/ACT AERO 2 puffs BID     Cholecalciferol 100 MCG (4000 UT) CAPS Take by mouth.     dicyclomine (BENTYL) 20 MG tablet Take 20 mg by mouth 4 (four) times daily.     estradiol (ESTRACE) 0.1 MG/GM vaginal cream Place vaginally as needed.     famotidine (PEPCID) 40 MG tablet TAKE 1 TABLET BY MOUTH EVERY EVENING 30 tablet 1   Fluticasone Propionate (FLONASE NA) Place into the nose.     levalbuterol (XOPENEX HFA) 45 MCG/ACT inhaler Inhale 2 puffs into the lungs every 6 (six) hours as needed for wheezing or shortness of breath. 1 each 5   montelukast (SINGULAIR) 10 MG tablet TAKE 1 TABLET BY MOUTH AT BEDTIME 30 tablet 3   Multiple Vitamin (MULTIVITAMIN) capsule Take 1 capsule by mouth daily.     naratriptan (AMERGE) 2.5 MG tablet Take 2.5 mg by mouth as needed for migraine. Take one (1) tablet at onset of headache; if returns or does not resolve, may repeat after 4 hours; do not exceed five (5) mg in 24 hours.     ondansetron (ZOFRAN-ODT) 4 MG disintegrating tablet Take 2 mg by mouth every 6 (six) hours as needed.     pantoprazole (PROTONIX) 40 MG tablet Take 40 mg by mouth daily.     rosuvastatin (CRESTOR) 5 MG tablet Take 5 mg by mouth daily.     topiramate (TOPAMAX) 50 MG tablet Take 50-100 mg by mouth at bedtime.     No current facility-administered medications for this visit.    Allergies  Allergen Reactions   Aleve [Naproxen Sodium] Anaphylaxis    Review of Systems:  neg     Physical Exam:    BP 110/80 (BP Location: Left Arm, Patient Position: Sitting, Cuff Size: Normal)   Pulse 92   Ht 5\' 4"  (1.626 m)   Wt 136 lb 4 oz (61.8 kg)   BMI 23.39 kg/m  Wt Readings from Last 3 Encounters:  07/30/23 136 lb 4 oz (61.8 kg)  05/11/23 139 lb 12.8 oz (63.4 kg)  11/04/22 139 lb 3.2 oz  (63.1 kg)   Constitutional:  Well-developed, in lot of pain- in tears Psychiatric: Normal mood and affect. Behavior is normal. HEENT: Pupils normal.  Conjunctivae are normal. No scleral icterus. Cardiovascular: Normal rate, regular rhythm. No edema Pulmonary/chest: Effort normal and breath sounds normal. No wheezing, rales or rhonchi. Abdominal: Soft, nondistended. R tenderness. Bowel sounds active throughout. There are no masses palpable. No hepatomegaly.  Neurological:  Alert and oriented to person place and time. Skin: Skin is warm and dry. No rashes noted.  Data Reviewed: I have personally reviewed following labs and imaging studies  CBC:    Latest Ref Rng & Units 07/13/2023   11:04 AM 08/25/2022   10:43 AM 08/23/2020   12:14 PM  CBC  WBC 4.0 - 10.5 K/uL 4.6  5.2  6.6   Hemoglobin 12.0 - 15.0 g/dL 16.1  09.6  04.5   Hematocrit 36.0 - 46.0 % 43.5  39.6  40.5   Platelets 150 - 400 K/uL 263  255.0  227.0       Edman Circle, MD 07/30/2023, 10:14 AM  Cc: Alinda Deem, MD

## 2023-08-03 ENCOUNTER — Encounter: Payer: Self-pay | Admitting: Gastroenterology

## 2023-08-03 NOTE — Telephone Encounter (Signed)
 Patient f/u on previous note. Please advise.   Thank you

## 2023-08-03 NOTE — Telephone Encounter (Signed)
Please see note from pt and advise.  °

## 2023-08-03 NOTE — Telephone Encounter (Signed)
 Spoke with pt, Chart reviewed and noted that pt had sent a my chart message earlier that was sent to Dr. Chales Abrahams.  Pt made aware once Dr. Chales Abrahams reviews and advises then we will reach out to the pt and make her aware of Dr. Chales Abrahams recommendations. Pt verbalized understanding with all questions answered.

## 2023-08-05 NOTE — Telephone Encounter (Signed)
 For autoimmune, needs to see a rheumatologist RG

## 2023-08-05 NOTE — Telephone Encounter (Signed)
 No inflammatory bowel disease.(Neg EGD, colon, CT, MRI) Could have costochondritis which responded well to steroids.  For liver cyst, I have messaged IR Dr. Grant Fontana for his response, if drainage would help.  RG

## 2023-08-06 DIAGNOSIS — R932 Abnormal findings on diagnostic imaging of liver and biliary tract: Secondary | ICD-10-CM | POA: Diagnosis not present

## 2023-08-06 DIAGNOSIS — R1011 Right upper quadrant pain: Secondary | ICD-10-CM | POA: Diagnosis not present

## 2023-08-06 DIAGNOSIS — R5382 Chronic fatigue, unspecified: Secondary | ICD-10-CM | POA: Diagnosis not present

## 2023-08-13 NOTE — Telephone Encounter (Signed)
 I had discussed with Dr. Nereida Banning (IR) Liver cyst can be drained by IR. I have discussed in detail with the patient over the phone last week and today  Brooke Haas (pt) does feel much better.  She would like to hold off on drainage currently and will let us  know if she starts having more problems.  RG

## 2023-08-19 ENCOUNTER — Ambulatory Visit: Admitting: Gastroenterology

## 2023-08-24 ENCOUNTER — Other Ambulatory Visit: Payer: Self-pay | Admitting: Primary Care

## 2023-08-24 MED ORDER — MONTELUKAST SODIUM 10 MG PO TABS: 10.0000 mg | ORAL_TABLET | Freq: Every day | ORAL | 3 refills | Status: DC

## 2023-08-24 NOTE — Telephone Encounter (Signed)
 Copied from CRM [id number]. Topic: Clinical - Medication Refill >> Aug 24, 2023 12:00 PM Crist Dominion wrote: Most Recent Primary Care Visit:   Medication: montelukast  (SINGULAIR ) 10 MG tablet  Has the patient contacted their pharmacy? Yes, pharmacy called [id number] for refill.  (Agent: If no, request that the patient contact the pharmacy for the refill. If patient does not wish to contact the pharmacy document the reason why and proceed with request.) (Agent: If yes, when and what did the pharmacy advise?)  Is this the correct pharmacy for this prescription? Yes If no, delete pharmacy and type the correct one.  This is the patient's preferred pharmacy:  [facility] [address] [place], [place] - [phone] [place] [address] Phone: [phone] Fax: [phone]  [facility] - [place], [place] - [address] [place] [address] Phone: [phone] Fax: [phone]   Has the prescription been filled recently? Yes  Is the patient out of the medication? Yes  Has the patient been seen for an appointment in the last year OR does the patient have an upcoming appointment? Yes  Can we respond through MyChart? No  Agent: Please be advised that Rx refills may take up to 3 business days. We ask that you follow-up with your pharmacy.

## 2023-08-28 DIAGNOSIS — R9082 White matter disease, unspecified: Secondary | ICD-10-CM | POA: Diagnosis not present

## 2023-08-28 DIAGNOSIS — R5382 Chronic fatigue, unspecified: Secondary | ICD-10-CM | POA: Diagnosis not present

## 2023-09-08 ENCOUNTER — Encounter (HOSPITAL_COMMUNITY): Payer: Self-pay | Admitting: Radiology

## 2023-09-08 ENCOUNTER — Inpatient Hospital Stay (HOSPITAL_COMMUNITY)
Admission: EM | Admit: 2023-09-08 | Discharge: 2023-09-10 | DRG: 373 | Disposition: A | Discharge status: Home / Self Care | Attending: Internal Medicine | Admitting: Internal Medicine

## 2023-09-08 ENCOUNTER — Other Ambulatory Visit: Payer: Self-pay

## 2023-09-08 ENCOUNTER — Emergency Department (HOSPITAL_COMMUNITY)

## 2023-09-08 DIAGNOSIS — Z8616 Personal history of COVID-19: Secondary | ICD-10-CM | POA: Diagnosis not present

## 2023-09-08 DIAGNOSIS — K219 Gastro-esophageal reflux disease without esophagitis: Secondary | ICD-10-CM | POA: Diagnosis not present

## 2023-09-08 DIAGNOSIS — A045 Campylobacter enteritis: Principal | ICD-10-CM | POA: Diagnosis present

## 2023-09-08 DIAGNOSIS — E860 Dehydration: Principal | ICD-10-CM | POA: Diagnosis present

## 2023-09-08 DIAGNOSIS — R109 Unspecified abdominal pain: Secondary | ICD-10-CM | POA: Diagnosis not present

## 2023-09-08 DIAGNOSIS — Z79899 Other long term (current) drug therapy: Secondary | ICD-10-CM

## 2023-09-08 DIAGNOSIS — Z886 Allergy status to analgesic agent status: Secondary | ICD-10-CM | POA: Diagnosis not present

## 2023-09-08 DIAGNOSIS — R1110 Vomiting, unspecified: Secondary | ICD-10-CM | POA: Diagnosis not present

## 2023-09-08 DIAGNOSIS — E876 Hypokalemia: Secondary | ICD-10-CM | POA: Diagnosis not present

## 2023-09-08 DIAGNOSIS — Z90710 Acquired absence of both cervix and uterus: Secondary | ICD-10-CM | POA: Diagnosis not present

## 2023-09-08 DIAGNOSIS — Z825 Family history of asthma and other chronic lower respiratory diseases: Secondary | ICD-10-CM | POA: Diagnosis not present

## 2023-09-08 DIAGNOSIS — R112 Nausea with vomiting, unspecified: Secondary | ICD-10-CM | POA: Diagnosis present

## 2023-09-08 DIAGNOSIS — E785 Hyperlipidemia, unspecified: Secondary | ICD-10-CM | POA: Diagnosis not present

## 2023-09-08 LAB — CBC WITH DIFFERENTIAL/PLATELET
Abs Immature Granulocytes: 0.08 10*3/uL — ABNORMAL HIGH (ref 0.00–0.07)
Basophils Absolute: 0.1 10*3/uL (ref 0.0–0.1)
Basophils Relative: 0 %
Eosinophils Absolute: 0 10*3/uL (ref 0.0–0.5)
Eosinophils Relative: 0 %
HCT: 44.3 % (ref 36.0–46.0)
Hemoglobin: 14.3 g/dL (ref 12.0–15.0)
Immature Granulocytes: 1 %
Lymphocytes Relative: 8 %
Lymphs Abs: 1 10*3/uL (ref 0.7–4.0)
MCH: 30.6 pg (ref 26.0–34.0)
MCHC: 32.3 g/dL (ref 30.0–36.0)
MCV: 94.9 fL (ref 80.0–100.0)
Monocytes Absolute: 0.9 10*3/uL (ref 0.1–1.0)
Monocytes Relative: 7 %
Neutro Abs: 10.7 10*3/uL — ABNORMAL HIGH (ref 1.7–7.7)
Neutrophils Relative %: 84 %
Platelets: 323 10*3/uL (ref 150–400)
RBC: 4.67 MIL/uL (ref 3.87–5.11)
RDW: 12.4 % (ref 11.5–15.5)
WBC: 12.8 10*3/uL — ABNORMAL HIGH (ref 4.0–10.5)
nRBC: 0 % (ref 0.0–0.2)

## 2023-09-08 LAB — URINALYSIS, ROUTINE W REFLEX MICROSCOPIC
Bilirubin Urine: NEGATIVE
Glucose, UA: NEGATIVE mg/dL
Hgb urine dipstick: NEGATIVE
Ketones, ur: 80 mg/dL — AB
Leukocytes,Ua: NEGATIVE
Nitrite: NEGATIVE
Protein, ur: NEGATIVE mg/dL
Specific Gravity, Urine: 1.01 (ref 1.005–1.030)
pH: 5 (ref 5.0–8.0)

## 2023-09-08 LAB — COMPREHENSIVE METABOLIC PANEL WITH GFR
ALT: 60 U/L — ABNORMAL HIGH (ref 0–44)
AST: 53 U/L — ABNORMAL HIGH (ref 15–41)
Albumin: 4.2 g/dL (ref 3.5–5.0)
Alkaline Phosphatase: 124 U/L (ref 38–126)
Anion gap: 10 (ref 5–15)
BUN: 16 mg/dL (ref 6–20)
CO2: 19 mmol/L — ABNORMAL LOW (ref 22–32)
Calcium: 9.1 mg/dL (ref 8.9–10.3)
Chloride: 106 mmol/L (ref 98–111)
Creatinine, Ser: 0.87 mg/dL (ref 0.44–1.00)
GFR, Estimated: >60 mL/min (ref >60)
Glucose, Bld: 86 mg/dL (ref 70–99)
Potassium: 3.7 mmol/L (ref 3.5–5.1)
Sodium: 135 mmol/L (ref 135–145)
Total Bilirubin: 1 mg/dL (ref 0.0–1.2)
Total Protein: 7.2 g/dL (ref 6.5–8.1)

## 2023-09-08 LAB — I-STAT CG4 LACTIC ACID, ED
Lactic Acid, Venous: 0.8 mmol/L (ref 0.5–1.9)
Lactic Acid, Venous: 0.6 mmol/L (ref 0.5–1.9)

## 2023-09-08 LAB — C DIFFICILE QUICK SCREEN W PCR REFLEX
C Diff antigen: NEGATIVE
C Diff interpretation: NOT DETECTED
C Diff toxin: NEGATIVE

## 2023-09-08 LAB — LIPASE, BLOOD: Lipase: 33 U/L (ref 11–51)

## 2023-09-08 MED ORDER — SODIUM CHLORIDE (PF) 0.9 % IJ SOLN
INTRAMUSCULAR | Status: AC
  Filled 2023-09-08: qty 50

## 2023-09-08 MED ORDER — SODIUM CHLORIDE 0.9 % IV BOLUS
1000.0000 mL | Freq: Once | INTRAVENOUS | Status: AC
  Administered 2023-09-08: 1000 mL via INTRAVENOUS

## 2023-09-08 MED ORDER — ENOXAPARIN SODIUM 40 MG/0.4ML IJ SOSY
40.0000 mg | PREFILLED_SYRINGE | INTRAMUSCULAR | Status: DC
  Administered 2023-09-08 – 2023-09-09 (×2): 40 mg via SUBCUTANEOUS
  Filled 2023-09-08 (×2): qty 0.4

## 2023-09-08 MED ORDER — MONTELUKAST SODIUM 10 MG PO TABS
10.0000 mg | ORAL_TABLET | Freq: Every day | ORAL | Status: DC
  Administered 2023-09-08 – 2023-09-09 (×2): 10 mg via ORAL
  Filled 2023-09-08 (×2): qty 1

## 2023-09-08 MED ORDER — ACETAMINOPHEN 325 MG PO TABS
650.0000 mg | ORAL_TABLET | Freq: Four times a day (QID) | ORAL | Status: DC | PRN
  Administered 2023-09-08 – 2023-09-09 (×2): 650 mg via ORAL
  Filled 2023-09-08 (×2): qty 2

## 2023-09-08 MED ORDER — MORPHINE SULFATE (PF) 2 MG/ML IV SOLN: 2.0000 mg | INTRAVENOUS | Status: DC | PRN

## 2023-09-08 MED ORDER — SODIUM CHLORIDE 0.9 % IV SOLN: INTRAVENOUS | Status: AC

## 2023-09-08 MED ORDER — ALBUTEROL SULFATE (2.5 MG/3ML) 0.083% IN NEBU: 2.5000 mg | INHALATION_SOLUTION | RESPIRATORY_TRACT | Status: DC | PRN

## 2023-09-08 MED ORDER — ONDANSETRON HCL 4 MG/2ML IJ SOLN
4.0000 mg | Freq: Four times a day (QID) | INTRAMUSCULAR | Status: DC | PRN
  Administered 2023-09-09 – 2023-09-10 (×3): 4 mg via INTRAVENOUS
  Filled 2023-09-08 (×3): qty 2

## 2023-09-08 MED ORDER — FENTANYL CITRATE PF 50 MCG/ML IJ SOSY
50.0000 ug | PREFILLED_SYRINGE | Freq: Once | INTRAMUSCULAR | Status: AC
  Administered 2023-09-08: 50 ug via INTRAVENOUS
  Filled 2023-09-08: qty 1

## 2023-09-08 MED ORDER — ONDANSETRON HCL 4 MG PO TABS: 4.0000 mg | ORAL_TABLET | Freq: Four times a day (QID) | ORAL | Status: DC | PRN

## 2023-09-08 MED ORDER — LACTATED RINGERS IV BOLUS
1000.0000 mL | Freq: Once | INTRAVENOUS | Status: AC
  Administered 2023-09-08: 1000 mL via INTRAVENOUS

## 2023-09-08 MED ORDER — PANTOPRAZOLE SODIUM 40 MG IV SOLR
40.0000 mg | INTRAVENOUS | Status: DC
  Administered 2023-09-08: 40 mg via INTRAVENOUS
  Filled 2023-09-08: qty 10

## 2023-09-08 MED ORDER — IOHEXOL 300 MG/ML  SOLN
100.0000 mL | Freq: Once | INTRAMUSCULAR | Status: AC | PRN
  Administered 2023-09-08: 100 mL via INTRAVENOUS

## 2023-09-08 MED ORDER — SODIUM CHLORIDE 0.9 % IV BOLUS
500.0000 mL | Freq: Once | INTRAVENOUS | Status: AC
  Administered 2023-09-08: 500 mL via INTRAVENOUS

## 2023-09-08 MED ORDER — ONDANSETRON HCL 4 MG/2ML IJ SOLN
4.0000 mg | Freq: Once | INTRAMUSCULAR | Status: AC
  Administered 2023-09-08: 4 mg via INTRAVENOUS
  Filled 2023-09-08: qty 2

## 2023-09-08 MED ORDER — TRAMADOL HCL 50 MG PO TABS
25.0000 mg | ORAL_TABLET | Freq: Once | ORAL | Status: AC
  Administered 2023-09-08: 25 mg via ORAL
  Filled 2023-09-08: qty 1

## 2023-09-08 MED ORDER — OXYCODONE HCL 5 MG PO TABS: 5.0000 mg | ORAL_TABLET | ORAL | Status: DC | PRN

## 2023-09-08 MED ORDER — LACTATED RINGERS IV BOLUS: 1000.0000 mL | Freq: Once | INTRAVENOUS | Status: DC

## 2023-09-08 MED ORDER — ACETAMINOPHEN 650 MG RE SUPP: 650.0000 mg | Freq: Four times a day (QID) | RECTAL | Status: DC | PRN

## 2023-09-08 NOTE — ED Triage Notes (Signed)
 Pt reports recurrent RUQ abdominal pain. Is followed by [facility]. Awaiting follow up with IR for possible drainage.   Triage VS HR 122 BP 87/61

## 2023-09-08 NOTE — Progress Notes (Addendum)
 Patient in Red mews from dayshift - HR, temp & BP. She is here for N/V/dehydration - hx liver cyst. Temp rechecked after tylenol  and still 101.5 with pain 4/10. Can't take oxy or morphine  d/t BP drop. Allergic to ibuprofen. Took off a few covers but she is cold. Notified J. Sharion Davidson, NP. New order for bolus and PO tramadol. Will continue to monitor.   1253am - BP 84/54 map 63. Notified J. Sharion Davidson, NP. Bolus ordered and started. Will continue to monitor.

## 2023-09-08 NOTE — Progress Notes (Signed)
   09/08/23 1847  Assess: MEWS Score  Temp (!) 101.5 F (38.6 C)  BP (!) 94/49  MAP (mmHg) (!) 64  Pulse Rate 98  Resp 12  SpO2 97 %  O2 Device Room Air  Assess: MEWS Score  MEWS Temp 2  MEWS Systolic 1  MEWS Pulse 0  MEWS RR 1  MEWS LOC 0  MEWS Score 4  MEWS Score Color Red  Assess: if the MEWS score is Yellow or Red  Were vital signs accurate and taken at a resting state? Yes  Does the patient meet 2 or more of the SIRS criteria? Yes  Does the patient have a confirmed or suspected source of infection? Yes  MEWS guidelines implemented  Yes, red  Treat  MEWS Interventions Considered administering scheduled or prn medications/treatments as ordered  Take Vital Signs  Increase Vital Sign Frequency  Red: Q1hr x2, continue Q4hrs until patient remains green for 12hrs  Escalate  MEWS: Escalate Red: Discuss with charge nurse and notify provider. Consider notifying RRT. If remains red for 2 hours consider need for higher level of care  Notify: Charge Nurse/RN  Name of Charge Nurse/RN Notified Park Bolk, RN  Notify: Rapid Response  Name of Rapid Response RN Notified Catherin Closs, RN  Date Rapid Response Notified 09/08/23  Time Rapid Response Notified 1850  Assess: SIRS CRITERIA  SIRS Temperature  1  SIRS Respirations  0  SIRS Pulse 1  SIRS WBC 0  SIRS Score Sum  2

## 2023-09-08 NOTE — ED Provider Notes (Signed)
 [facility] EMERGENCY DEPARTMENT AT [facility] Provider Note   CSN: [id number] Arrival date & time: 09/08/23  0546     History Chief Complaint  Patient presents with   Abdominal Pain    Brooke Haas is a 53 y.o. female with h/o chronic right sided abdominal pain, GERD, migraines, presents to the ER today for evaluation of NVD since yesterday. The patient reports that she has had between 10-15 episodes of nonbloody, nonmelanotic diarrhea.  She also ports that she has had between 5-10 episodes of nonbloody emesis.  She reports that she thought she may have had a fever but recorded temperature of 99.45F orally.  Additionally, the patient reports that she sees Dr. Venice Gillis with [facility] and is supposed be getting a drain placed for a liver cyst however this is still to be determined/scheduled.  She reports that she was constipated and usually struggles with constipation and had only 1 capful of MiraLAX on Sunday when she started to have symptoms.  She denies any chest pain or shortness of breath.  She denies anybody with similar sick symptoms.  Reports she has not been able to tolerate any foods or fluids and has had decrease in p.o. intake.  She is allergic to ibuprofen.  Denies any tobacco, EtOH, other drug use.  No new medications.  Has tried Zofran  at home without much relief.   Abdominal Pain Associated symptoms: diarrhea, nausea and vomiting   Associated symptoms: no chest pain, no chills, no dysuria, no fever, no hematuria, no shortness of breath, no vaginal bleeding and no vaginal discharge        Home Medications Prior to Admission medications   Medication Sig Start Date End Date Taking? Authorizing Provider  BREZTRI AEROSPHERE 160-9-4.8 MCG/ACT AERO 2 puffs BID 12/31/21   [provider]  Cholecalciferol 100 MCG (4000 UT) CAPS Take by mouth.    [provider]  dicyclomine (BENTYL) 20 MG tablet Take 20 mg by mouth 4 (four) times daily. 07/21/23    [provider]  estradiol (ESTRACE) 0.1 MG/GM vaginal cream Place vaginally as needed. 01/04/20   [provider]  famotidine  (PEPCID ) 40 MG tablet TAKE 1 TABLET BY MOUTH EVERY EVENING 05/22/21   Kozlow, Eric J, MD  Fluticasone Propionate (FLONASE NA) Place into the nose.    [provider]  levalbuterol  (XOPENEX  HFA) 45 MCG/ACT inhaler Inhale 2 puffs into the lungs every 6 (six) hours as needed for wheezing or shortness of breath. 12/05/22 12/05/23  Margaretann Sharper, MD  montelukast  (SINGULAIR ) 10 MG tablet Take 1 tablet (10 mg total) by mouth at bedtime. 08/24/23   Margaretann Sharper, MD  Multiple Vitamin (MULTIVITAMIN) capsule Take 1 capsule by mouth daily.    [provider]  naratriptan (AMERGE) 2.5 MG tablet Take 2.5 mg by mouth as needed for migraine. Take one (1) tablet at onset of headache; if returns or does not resolve, may repeat after 4 hours; do not exceed five (5) mg in 24 hours.    [provider]  ondansetron  (ZOFRAN -ODT) 4 MG disintegrating tablet Take 2 mg by mouth every 6 (six) hours as needed. 07/21/23   [provider]  pantoprazole  (PROTONIX ) 40 MG tablet Take 40 mg by mouth daily. 12/23/21   [provider]  rosuvastatin (CRESTOR) 5 MG tablet Take 5 mg by mouth daily.    [provider]  topiramate (TOPAMAX) 50 MG tablet Take 50-100 mg by mouth at bedtime. 10/17/21   [provider]      Allergies    Aleve [naproxen sodium]    Review of Systems   Review of Systems  Constitutional:  Negative for chills and fever.  Respiratory:  Negative for shortness of breath.   Cardiovascular:  Negative for chest pain.  Gastrointestinal:  Positive for diarrhea, nausea and vomiting. Negative for blood in stool.  Genitourinary:  Negative for dysuria, frequency, hematuria, pelvic pain, urgency, vaginal bleeding and vaginal discharge.    Physical Exam Updated Vital Signs BP (!) 91/54   Pulse 98   Temp 98.8 F  (37.1 C) (Oral)   Resp 18   SpO2 98%  Physical Exam Vitals and nursing note reviewed.  Constitutional:      Appearance: She is not toxic-appearing.     Comments: Uncomfortable, but not toxic appearing  Cardiovascular:     Rate and Rhythm: Tachycardia present.     Pulses:          Radial pulses are 2+ on the right side and 2+ on the left side.       Dorsalis pedis pulses are 2+ on the right side and 2+ on the left side.       Posterior tibial pulses are 2+ on the right side and 2+ on the left side.  Pulmonary:     Effort: Pulmonary effort is normal. No respiratory distress.  Abdominal:     General: Bowel sounds are normal.     Palpations: Abdomen is soft.     Tenderness: There is no abdominal tenderness. There is no guarding or rebound.  Skin:    General: Skin is warm and dry.  Neurological:     Mental Status: She is alert.     ED Results / Procedures / Treatments   Labs (all labs ordered are listed, but only abnormal results are displayed) Labs Reviewed  COMPREHENSIVE METABOLIC PANEL WITH GFR - Abnormal; Notable for the following components:      Result Value   CO2 19 (*)    AST 53 (*)    ALT 60 (*)    All other components within normal limits  CBC WITH DIFFERENTIAL/PLATELET - Abnormal; Notable for the following components:   WBC 12.8 (*)    Neutro Abs 10.7 (*)    Abs Immature Granulocytes 0.08 (*)    All other components within normal limits  GASTROINTESTINAL PANEL BY PCR, STOOL (REPLACES STOOL CULTURE)  C DIFFICILE QUICK SCREEN W PCR REFLEX    LIPASE, BLOOD  URINALYSIS, ROUTINE W REFLEX MICROSCOPIC  I-STAT CG4 LACTIC ACID, ED  I-STAT CG4 LACTIC ACID, ED    EKG None  Radiology CT ABDOMEN PELVIS W CONTRAST Result Date: 09/08/2023 CLINICAL DATA:  Abdominal pain acute, nonlocalized EXAM: CT ABDOMEN AND PELVIS WITH CONTRAST TECHNIQUE: Multidetector CT imaging of the abdomen and pelvis was performed using the standard protocol following bolus administration of  intravenous contrast. RADIATION DOSE REDUCTION: This exam was performed according to the departmental dose-optimization program which includes automated exposure control, adjustment of the mA and/or kV according to patient size and/or use of iterative reconstruction technique. CONTRAST:  OMNIPAQUE  IOHEXOL  300 MG/ML  SOLN COMPARISON:  July 30, 2023 FINDINGS: Lower chest: Hepatobiliary: Comparison with prior examinations demonstrates no change in the previously described 6.5 x 5.5 cm cystic appearing mass within the right posterolateral region of the right lobe of the liver stable since prior examination likely consistent with an exophytic cyst. Prior cholecystectomy without biliary dilatation. Pancreas: Unremarkable. No pancreatic ductal dilatation or surrounding  inflammatory changes. Spleen: Normal in size without focal abnormality. Adrenals/Urinary Tract: Adrenal glands are unremarkable. Kidneys are normal, without renal calculi, focal lesion, or hydronephrosis. Bladder is unremarkable. Stomach/Bowel: Stomach is within normal limits. Appendix appears normal. No evidence of bowel wall thickening, distention, or inflammatory changes. No evidence of appendicitis. No diverticulitis. Vascular/Lymphatic: No significant vascular findings are present. No enlarged abdominal or pelvic lymph nodes. Reproductive: Status post hysterectomy. No adnexal masses. Other: No abdominal wall hernia or abnormality. No abdominopelvic ascites. Musculoskeletal: No acute or significant osseous findings. IMPRESSION: *No acute findings in the abdomen or pelvis. *Stable 6.5 x 5.5 cm cystic appearing mass within the right posterolateral region of the right lobe of the liver likely consistent with an exophytic cyst. *Prior cholecystectomy without biliary dilatation. *Status post hysterectomy. No adnexal masses. Electronically Signed   By: Fredrich Jefferson M.D.   On: 09/08/2023 08:35    Procedures Procedures   Medications Ordered in  ED Medications  sodium chloride  0.9 % bolus 1,000 mL (0 mLs Intravenous Stopped 09/08/23 0825)  fentaNYL  (SUBLIMAZE ) injection 50 mcg (50 mcg Intravenous Given 09/08/23 0643)  ondansetron  (ZOFRAN ) injection 4 mg (4 mg Intravenous Given 09/08/23 0644)  iohexol  (OMNIPAQUE ) 300 MG/ML solution 100 mL (100 mLs Intravenous Contrast Given 09/08/23 0806)  sodium chloride  0.9 % bolus 1,000 mL (1,000 mLs Intravenous New Bag/Given 09/08/23 0850)    ED Course/ Medical Decision Making/ A&P Clinical Course as of 09/08/23 7846  Tue Sep 08, 2023  0940 On the phone with Bridgette Campus with GI. Recommends stool study. Thinks this is likely viral.  [RR]    Clinical Course User Index [RR] Spence Dux, PA-C   {  Medical Decision Making Amount and/or Complexity of Data Reviewed Labs: ordered. Radiology: ordered.  Risk Prescription drug management. Decision regarding hospitalization.   53 y.o. female presents to the ER for evaluation of NVD. Differential diagnosis includes but is not limited to Infectious diarrhea, GI Bleed, Appendicitis, Mesenteric Ischemia, Diverticulitis, endocrine causes (adrenal, thyroid ), IBD. Vital signs softer BP, however patient does have borderline low BP at baseline, borderline tachycardia. Physical exam as noted above.   No abdominal tenderness to palpation. Given history of constipation and now having NVD, will scan for possible obstruction. Labs, fluids, and medications ordered as well.   I independently reviewed and interpreted the patient's labs.  Urinalysis still needs to be collected.  Lactic acid within normal limits at 0.6.  Lipase within normal limits.  CMP shows bicarb of 19.  Mildly elevated AST and ALT however appear to be on baseline.  No other leukocytosis or anemia.  CBC does show slightly elevated white blood cell count of 12.8 with a left shift.  No anemia.  C. difficile and PCR stool pending/needs to be collected.  CT shows *No acute findings in the abdomen or pelvis.  *Stable 6.5 x 5.5 cm cystic appearing mass within the right posterolateral region of the right lobe of the liver likely consistent with an exophytic cyst. *Prior cholecystectomy without biliary dilatation. *Status post hysterectomy. No adnexal masses. Per radiologist's interpretation.    I consulted GI who recommended stool studies.  No other recommendations at this time.  Thinks may be viral in nature and not related to her previous GI history.  After 2L of IVF, the patient still has not made any urine. Given her borderline tachycardia and still soft BP, the patient needs admission for further management and observation.   Will admit for observation for further IVF, stool sample, and monitoring of BP.  Patient and family agree with admission. TRH to admit.   Portions of this report may have been transcribed using voice recognition software. Every effort was made to ensure accuracy; however, inadvertent computerized transcription errors may be present.   Final Clinical Impression(s) / ED Diagnoses Final diagnoses:  Dehydration  Nausea, vomiting and diarrhea    Rx / DC Orders ED Discharge Orders     None         Spence Dux, PA-C 09/08/23 1711    Auston Blush, MD 09/09/23 [phone]

## 2023-09-08 NOTE — H&P (Signed)
 History and Physical  Brooke Haas EAV:[id number] DOB: 1970/08/13 DOA: 09/08/2023  PCP: Orin Birk, MD   Chief Complaint: Intractable vomiting, diarrhea  HPI: Brooke Haas is a 53 y.o. female with medical history significant for migraine headaches, hyperlipidemia and known liver cyst felt to be causing chronic abdominal pain who is being admitted to the hospital with 48 hours of intractable nausea, vomiting and diarrhea.  She denies any chest pain, fevers or chills.  She has been having some chronic abdominal pain and associated elevated liver enzymes for the last couple of months, has been followed by [facility] gastroenterology and there is a plan for drainage of an exophytic liver cyst.  Unfortunately over the last couple of days, she has had onset of intractable nausea and vomiting.  She denies any fevers, hematemesis, or blood in her stools.  He has been feeling a little bit dizzy and lightheaded, states that she has been unable to keep down anything even liquids for the last couple of days.  Review of Systems: Please see HPI for pertinent positives and negatives. A complete 10 system review of systems are otherwise negative.  Past Medical History:  Diagnosis Date   Allergy    seasonal   Anemia    years ago prior to hysterectomy   Asthma    Breast mass, right    Breast mass, right    COVID-19 virus infection 06/16/2021   GERD (gastroesophageal reflux disease)    Headache    Hyperlipidemia    Past Surgical History:  Procedure Laterality Date   ABDOMINAL HYSTERECTOMY     BREAST BIOPSY Left    BREAST EXCISIONAL BIOPSY Right    CESAREAN SECTION     x 3   CHOLECYSTECTOMY     COLONOSCOPY     ESOPHAGOGASTRODUODENOSCOPY  12/11/2004   Mild gastritis. Otherwise normal EGD   RADIOACTIVE SEED GUIDED EXCISIONAL BREAST BIOPSY Right 12/01/2016   Procedure: RIGHT RADIOACTIVE SEED GUIDED EXCISIONAL BREAST BIOPSY;  Surgeon: Enid Harry, MD;  Location: [facility];  Service: General;  Laterality: Right;   ROTATOR CUFF REPAIR Right    SINOSCOPY     TONSILLECTOMY     UPPER GASTROINTESTINAL ENDOSCOPY     Social History:  reports that she has never smoked. She has never used smokeless tobacco. She reports current alcohol  use. She reports that she does not use drugs.  Allergies  Allergen Reactions   Aleve [Naproxen Sodium] Anaphylaxis    Family History  Problem Relation Age of Onset   Allergic rhinitis Mother    Breast cancer Mother 6   Allergic rhinitis Father    Allergic rhinitis Sister    Asthma Sister    Breast cancer Paternal Grandmother        29s   Colon cancer Neg Hx    Pancreatic cancer Neg Hx    Liver cancer Neg Hx    Esophageal cancer Neg Hx    Rectal cancer Neg Hx    Colon polyps Neg Hx    Stomach cancer Neg Hx      Prior to Admission medications   Medication Sig Start Date End Date Taking? Authorizing Provider  BREZTRI AEROSPHERE 160-9-4.8 MCG/ACT AERO 2 puffs BID 12/31/21   [provider]  Cholecalciferol 100 MCG (4000 UT) CAPS Take by mouth.    [provider]  dicyclomine (BENTYL) 20 MG tablet Take 20 mg by mouth 4 (four) times daily. 07/21/23   [provider]  estradiol (ESTRACE) 0.1 MG/GM vaginal  cream Place vaginally as needed. 01/04/20   [provider]  famotidine  (PEPCID ) 40 MG tablet TAKE 1 TABLET BY MOUTH EVERY EVENING 05/22/21   Kozlow, Eric J, MD  Fluticasone Propionate (FLONASE NA) Place into the nose.    [provider]  levalbuterol  (XOPENEX  HFA) 45 MCG/ACT inhaler Inhale 2 puffs into the lungs every 6 (six) hours as needed for wheezing or shortness of breath. 12/05/22 12/05/23  Margaretann Sharper, MD  montelukast  (SINGULAIR ) 10 MG tablet Take 1 tablet (10 mg total) by mouth at bedtime. 08/24/23   Margaretann Sharper, MD  Multiple Vitamin (MULTIVITAMIN) capsule Take 1 capsule by mouth daily.    [provider]  naratriptan (AMERGE) 2.5 MG tablet Take  2.5 mg by mouth as needed for migraine. Take one (1) tablet at onset of headache; if returns or does not resolve, may repeat after 4 hours; do not exceed five (5) mg in 24 hours.    [provider]  ondansetron  (ZOFRAN -ODT) 4 MG disintegrating tablet Take 2 mg by mouth every 6 (six) hours as needed. 07/21/23   [provider]  pantoprazole  (PROTONIX ) 40 MG tablet Take 40 mg by mouth daily. 12/23/21   [provider]  rosuvastatin (CRESTOR) 5 MG tablet Take 5 mg by mouth daily.    [provider]  topiramate (TOPAMAX) 50 MG tablet Take 50-100 mg by mouth at bedtime. 10/17/21   [provider]    Physical Exam: BP (!) 90/51   Pulse 96   Temp 98.8 F (37.1 C) (Oral)   Resp 20   SpO2 99%  General:  Alert, oriented, calm, daughter is at the bedside.  In no acute distress but looks very tired, pale and weak. Cardiovascular: RRR, no murmurs or rubs, no peripheral edema  Respiratory: clear to auscultation bilaterally, no wheezes, no crackles  Abdomen: soft, nontender, nondistended, normal bowel tones heard  Skin: dry, no rashes  Musculoskeletal: no joint effusions, normal range of motion  Psychiatric: appropriate affect, normal speech  Neurologic: extraocular muscles intact, clear speech, moving all extremities with intact sensorium         Labs on Admission:  Basic Metabolic Panel: Recent Labs  Lab 09/08/23 0620  NA 135  K 3.7  CL 106  CO2 19*  GLUCOSE 86  BUN 16  CREATININE 0.87  CALCIUM 9.1   Liver Function Tests: Recent Labs  Lab 09/08/23 0620  AST 53*  ALT 60*  ALKPHOS 124  BILITOT 1.0  PROT 7.2  ALBUMIN 4.2   Recent Labs  Lab 09/08/23 0620  LIPASE 33   No results for input(s): "AMMONIA" in the last 168 hours. CBC: Recent Labs  Lab 09/08/23 0620  WBC 12.8*  NEUTROABS 10.7*  HGB 14.3  HCT 44.3  MCV 94.9  PLT 323   Cardiac Enzymes: No results for input(s): "CKTOTAL", "CKMB", "CKMBINDEX", "TROPONINI" in the last  168 hours. BNP (last 3 results) No results for input(s): "BNP" in the last 8760 hours.  ProBNP (last 3 results) No results for input(s): "PROBNP" in the last 8760 hours.  CBG: No results for input(s): "GLUCAP" in the last 168 hours.  Radiological Exams on Admission: CT ABDOMEN PELVIS W CONTRAST Result Date: 09/08/2023 CLINICAL DATA:  Abdominal pain acute, nonlocalized EXAM: CT ABDOMEN AND PELVIS WITH CONTRAST TECHNIQUE: Multidetector CT imaging of the abdomen and pelvis was performed using the standard protocol following bolus administration of intravenous contrast. RADIATION DOSE REDUCTION: This exam was performed according to the departmental dose-optimization  program which includes automated exposure control, adjustment of the mA and/or kV according to patient size and/or use of iterative reconstruction technique. CONTRAST:  OMNIPAQUE  IOHEXOL  300 MG/ML  SOLN COMPARISON:  July 30, 2023 FINDINGS: Lower chest: Hepatobiliary: Comparison with prior examinations demonstrates no change in the previously described 6.5 x 5.5 cm cystic appearing mass within the right posterolateral region of the right lobe of the liver stable since prior examination likely consistent with an exophytic cyst. Prior cholecystectomy without biliary dilatation. Pancreas: Unremarkable. No pancreatic ductal dilatation or surrounding inflammatory changes. Spleen: Normal in size without focal abnormality. Adrenals/Urinary Tract: Adrenal glands are unremarkable. Kidneys are normal, without renal calculi, focal lesion, or hydronephrosis. Bladder is unremarkable. Stomach/Bowel: Stomach is within normal limits. Appendix appears normal. No evidence of bowel wall thickening, distention, or inflammatory changes. No evidence of appendicitis. No diverticulitis. Vascular/Lymphatic: No significant vascular findings are present. No enlarged abdominal or pelvic lymph nodes. Reproductive: Status post hysterectomy. No adnexal masses. Other: No  abdominal wall hernia or abnormality. No abdominopelvic ascites. Musculoskeletal: No acute or significant osseous findings. IMPRESSION: *No acute findings in the abdomen or pelvis. *Stable 6.5 x 5.5 cm cystic appearing mass within the right posterolateral region of the right lobe of the liver likely consistent with an exophytic cyst. *Prior cholecystectomy without biliary dilatation. *Status post hysterectomy. No adnexal masses. Electronically Signed   By: Fredrich Jefferson M.D.   On: 09/08/2023 08:35   Assessment/Plan Virgin Lindora Macdermott is a 53 y.o. female with medical history significant for migraine headaches, hyperlipidemia and known liver cyst felt to be causing chronic abdominal pain who is being admitted to the hospital with 48 hours of intractable nausea, vomiting and diarrhea.    Intractable vomiting diarrhea-unclear etiology, likely viral gastroenteritis and probably not related to her known liver cyst and chronic abdominal pain.  Despite receiving 3 L of IV fluids, patient is still relatively hypotensive, and not urinating.  She has been unable to keep anything down in the ER. -Observation -IV fluids -Pain and nausea medication as needed -ER provider has discussed with gastroenterology, who recommends stool studies  GERD-IV PPI  Hyperlipidemia-will hold Crestor while vomiting  History of asthma-will continue home inhalers once reconciled  DVT prophylaxis: Lovenox     Code Status: Full Code  Consults called: GI  Admission status: Observation  Time spent: 55 minutes  Harmon Bommarito Rickey Charm MD [facility] Pager [phone]  If 7PM-7AM, please contact night-coverage [email or web address] Password [facility]  09/08/2023, 10:15 AM

## 2023-09-09 DIAGNOSIS — Z8616 Personal history of COVID-19: Secondary | ICD-10-CM | POA: Diagnosis not present

## 2023-09-09 DIAGNOSIS — R112 Nausea with vomiting, unspecified: Secondary | ICD-10-CM | POA: Diagnosis present

## 2023-09-09 DIAGNOSIS — R1110 Vomiting, unspecified: Secondary | ICD-10-CM | POA: Diagnosis not present

## 2023-09-09 DIAGNOSIS — A045 Campylobacter enteritis: Secondary | ICD-10-CM | POA: Diagnosis present

## 2023-09-09 DIAGNOSIS — Z886 Allergy status to analgesic agent status: Secondary | ICD-10-CM | POA: Diagnosis not present

## 2023-09-09 DIAGNOSIS — Z79899 Other long term (current) drug therapy: Secondary | ICD-10-CM | POA: Diagnosis not present

## 2023-09-09 DIAGNOSIS — Z825 Family history of asthma and other chronic lower respiratory diseases: Secondary | ICD-10-CM | POA: Diagnosis not present

## 2023-09-09 DIAGNOSIS — E860 Dehydration: Secondary | ICD-10-CM | POA: Diagnosis present

## 2023-09-09 DIAGNOSIS — E785 Hyperlipidemia, unspecified: Secondary | ICD-10-CM | POA: Diagnosis present

## 2023-09-09 DIAGNOSIS — E876 Hypokalemia: Secondary | ICD-10-CM | POA: Diagnosis present

## 2023-09-09 DIAGNOSIS — K219 Gastro-esophageal reflux disease without esophagitis: Secondary | ICD-10-CM | POA: Diagnosis present

## 2023-09-09 LAB — BASIC METABOLIC PANEL WITH GFR
Anion gap: 7 (ref 5–15)
BUN: 6 mg/dL (ref 6–20)
CO2: 19 mmol/L — ABNORMAL LOW (ref 22–32)
Calcium: 7.8 mg/dL — ABNORMAL LOW (ref 8.9–10.3)
Chloride: 114 mmol/L — ABNORMAL HIGH (ref 98–111)
Creatinine, Ser: 0.52 mg/dL (ref 0.44–1.00)
GFR, Estimated: >60 mL/min (ref >60)
Glucose, Bld: 97 mg/dL (ref 70–99)
Potassium: 3.2 mmol/L — ABNORMAL LOW (ref 3.5–5.1)
Sodium: 140 mmol/L (ref 135–145)

## 2023-09-09 LAB — GASTROINTESTINAL PANEL BY PCR, STOOL (REPLACES STOOL CULTURE)

## 2023-09-09 LAB — CBC
HCT: 34.5 % — ABNORMAL LOW (ref 36.0–46.0)
Hemoglobin: 11 g/dL — ABNORMAL LOW (ref 12.0–15.0)
MCH: 31.2 pg (ref 26.0–34.0)
MCHC: 31.9 g/dL (ref 30.0–36.0)
MCV: 97.7 fL (ref 80.0–100.0)
Platelets: 200 10*3/uL (ref 150–400)
RBC: 3.53 MIL/uL — ABNORMAL LOW (ref 3.87–5.11)
RDW: 12.5 % (ref 11.5–15.5)
WBC: 5.7 10*3/uL (ref 4.0–10.5)
nRBC: 0 % (ref 0.0–0.2)

## 2023-09-09 LAB — HIV ANTIBODY (ROUTINE TESTING W REFLEX): HIV Screen 4th Generation wRfx: NONREACTIVE

## 2023-09-09 MED ORDER — AZITHROMYCIN 500 MG PO TABS
500.0000 mg | ORAL_TABLET | Freq: Every day | ORAL | Status: DC
  Administered 2023-09-09 – 2023-09-10 (×2): 500 mg via ORAL
  Filled 2023-09-09 (×2): qty 1

## 2023-09-09 MED ORDER — SODIUM CHLORIDE 0.9 % IV BOLUS
500.0000 mL | Freq: Once | INTRAVENOUS | Status: AC
  Administered 2023-09-09: 500 mL via INTRAVENOUS

## 2023-09-09 MED ORDER — SODIUM CHLORIDE 0.9 % IV SOLN: INTRAVENOUS | Status: AC

## 2023-09-09 MED ORDER — TOPIRAMATE 100 MG PO TABS
100.0000 mg | ORAL_TABLET | Freq: Every day | ORAL | Status: DC
  Administered 2023-09-09: 100 mg via ORAL
  Filled 2023-09-09: qty 1

## 2023-09-09 MED ORDER — BUDESON-GLYCOPYRROL-FORMOTEROL 160-9-4.8 MCG/ACT IN AERO
2.0000 | INHALATION_SPRAY | Freq: Two times a day (BID) | RESPIRATORY_TRACT | Status: DC
  Administered 2023-09-09 – 2023-09-10 (×2): 2 via RESPIRATORY_TRACT
  Filled 2023-09-09: qty 5.9

## 2023-09-09 MED ORDER — POTASSIUM CHLORIDE CRYS ER 20 MEQ PO TBCR
40.0000 meq | EXTENDED_RELEASE_TABLET | Freq: Once | ORAL | Status: AC
  Administered 2023-09-09: 40 meq via ORAL
  Filled 2023-09-09: qty 2

## 2023-09-09 MED ORDER — PANTOPRAZOLE SODIUM 40 MG PO TBEC
40.0000 mg | DELAYED_RELEASE_TABLET | Freq: Every day | ORAL | Status: DC
  Administered 2023-09-09: 40 mg via ORAL
  Filled 2023-09-09: qty 1

## 2023-09-09 MED ORDER — POTASSIUM CHLORIDE 10 MEQ/100ML IV SOLN: 10.0000 meq | INTRAVENOUS | Status: DC

## 2023-09-09 NOTE — Plan of Care (Signed)

## 2023-09-09 NOTE — Progress Notes (Signed)
 PROGRESS NOTE    Brooke Haas  QVZ:[id number] DOB: 01-Sep-1970 DOA: 09/08/2023 PCP: Orin Birk, MD    Brief Narrative:  Brooke Haas is a 53 y.o. female with medical history significant for migraine headaches, hyperlipidemia and known liver cyst felt to be causing chronic abdominal pain who is being admitted to the hospital with 48 hours of intractable nausea, vomiting and diarrhea.  She denies any chest pain, fevers or chills.  She has been having some chronic abdominal pain and associated elevated liver enzymes for the last couple of months, has been followed by [facility] gastroenterology and there is a plan for drainage of an exophytic liver cyst.  Unfortunately over the last couple of days, she has had onset of intractable nausea and vomiting.  Found to have Campylobacter.   Assessment and Plan: Campylobacter enteritis - Azithromycin  started 5/14 - IV fluids and other supportive care -Suspect home in the a.m. -As needed nausea medicine  Hypokalemia - Replete  DVT prophylaxis: enoxaparin (LOVENOX) injection 40 mg Start: 09/08/23 2200    Code Status: Full Code Family Communication: At bedside  Disposition Plan:  Level of care: Med-Surg Status is: Observation     Consultants:  None  Subjective: Overall feeling better but continues with some nausea  Objective: Vitals:   09/08/23 2200 09/09/23 0052 09/09/23 0427 09/09/23 1012  BP:  (!) 84/54 (!) 90/52 (!) 92/56  Pulse:  83 69 73  Resp: 16 17 18 18   Temp:  98.9 F (37.2 C) 98.5 F (36.9 C) 99.6 F (37.6 C)  TempSrc:  Oral Oral Oral  SpO2:  98% 100% 100%  Weight:      Height:        Intake/Output Summary (Last 24 hours) at 09/09/2023 1200 Last data filed at 09/09/2023 3295 Gross per 24 hour  Intake 2346.65 ml  Output --  Net 2346.65 ml   Filed Weights   09/08/23 1100  Weight: 61.7 kg    Examination:   General: Appearance:    Well developed, well nourished female who appears  uncomfortable     Lungs:    respirations unlabored  Heart:    Normal heart rate. Normal rhythm. No murmurs, rubs, or gallops.    MS:   All extremities are intact.    Neurologic:   Awake, alert, oriented x 3       Data Reviewed: I have personally reviewed following labs and imaging studies  CBC: Recent Labs  Lab 09/08/23 0620 09/09/23 0545  WBC 12.8* 5.7  NEUTROABS 10.7*  --   HGB 14.3 11.0*  HCT 44.3 34.5*  MCV 94.9 97.7  PLT 323 200   Basic Metabolic Panel: Recent Labs  Lab 09/08/23 0620 09/09/23 0545  NA 135 140  K 3.7 3.2*  CL 106 114*  CO2 19* 19*  GLUCOSE 86 97  BUN 16 6  CREATININE 0.87 0.52  CALCIUM 9.1 7.8*   GFR: Estimated Creatinine Clearance: 73.2 mL/min (by C-G formula based on SCr of 0.52 mg/dL). Liver Function Tests: Recent Labs  Lab 09/08/23 0620  AST 53*  ALT 60*  ALKPHOS 124  BILITOT 1.0  PROT 7.2  ALBUMIN 4.2   Recent Labs  Lab 09/08/23 0620  LIPASE 33   No results for input(s): "AMMONIA" in the last 168 hours. Coagulation Profile: No results for input(s): "INR", "PROTIME" in the last 168 hours. Cardiac Enzymes: No results for input(s): "CKTOTAL", "CKMB", "CKMBINDEX", "TROPONINI" in the last 168 hours. BNP (last 3 results) No results for  input(s): "PROBNP" in the last 8760 hours. HbA1C: No results for input(s): "HGBA1C" in the last 72 hours. CBG: No results for input(s): "GLUCAP" in the last 168 hours. Lipid Profile: No results for input(s): "CHOL", "HDL", "LDLCALC", "TRIG", "CHOLHDL", "LDLDIRECT" in the last 72 hours. Thyroid  Function Tests: No results for input(s): "TSH", "T4TOTAL", "FREET4", "T3FREE", "THYROIDAB" in the last 72 hours. Anemia Panel: No results for input(s): "VITAMINB12", "FOLATE", "FERRITIN", "TIBC", "IRON", "RETICCTPCT" in the last 72 hours. Sepsis Labs: Recent Labs  Lab 09/08/23 4098 09/08/23 0841  LATICACIDVEN 0.8 0.6    Recent Results (from the past 240 hours)  Gastrointestinal Panel by PCR ,  Stool     Status: Abnormal   Collection Time: 09/08/23 11:32 AM   Specimen: Stool  Result Value Ref Range Status   Campylobacter species DETECTED (A) NOT DETECTED Final    Comment: RESULT CALLED TO, READ BACK BY AND VERIFIED WITH: KIRISTIN RYAN RN @0058  09/09/23 ASW    Plesimonas shigelloides NOT DETECTED NOT DETECTED Final   Salmonella species NOT DETECTED NOT DETECTED Final   Yersinia enterocolitica NOT DETECTED NOT DETECTED Final   Vibrio species NOT DETECTED NOT DETECTED Final   Vibrio cholerae NOT DETECTED NOT DETECTED Final   Enteroaggregative E coli (EAEC) NOT DETECTED NOT DETECTED Final   Enteropathogenic E coli (EPEC) NOT DETECTED NOT DETECTED Final   Enterotoxigenic E coli (ETEC) NOT DETECTED NOT DETECTED Final   Shiga like toxin producing E coli (STEC) NOT DETECTED NOT DETECTED Final   Shigella/Enteroinvasive E coli (EIEC) NOT DETECTED NOT DETECTED Final   Cryptosporidium NOT DETECTED NOT DETECTED Final   Cyclospora cayetanensis NOT DETECTED NOT DETECTED Final   Entamoeba histolytica NOT DETECTED NOT DETECTED Final   Giardia lamblia NOT DETECTED NOT DETECTED Final   Adenovirus F40/41 NOT DETECTED NOT DETECTED Final   Astrovirus NOT DETECTED NOT DETECTED Final   Norovirus GI/GII NOT DETECTED NOT DETECTED Final   Rotavirus A NOT DETECTED NOT DETECTED Final   Sapovirus (I, II, IV, and V) NOT DETECTED NOT DETECTED Final    Comment: Performed at [facility], [address] [place], [place] [address]  C Difficile Quick Screen w PCR reflex     Status: None   Collection Time: 09/08/23 11:32 AM   Specimen: STOOL  Result Value Ref Range Status   C Diff antigen NEGATIVE NEGATIVE Final   C Diff toxin NEGATIVE NEGATIVE Final   C Diff interpretation No C. difficile detected.  Final    Comment: Performed at [facility], [address], [place], [place] [address]         Radiology Studies: CT ABDOMEN PELVIS W CONTRAST Result Date:  09/08/2023 CLINICAL DATA:  Abdominal pain acute, nonlocalized EXAM: CT ABDOMEN AND PELVIS WITH CONTRAST TECHNIQUE: Multidetector CT imaging of the abdomen and pelvis was performed using the standard protocol following bolus administration of intravenous contrast. RADIATION DOSE REDUCTION: This exam was performed according to the departmental dose-optimization program which includes automated exposure control, adjustment of the mA and/or kV according to patient size and/or use of iterative reconstruction technique. CONTRAST:  OMNIPAQUE  IOHEXOL  300 MG/ML  SOLN COMPARISON:  July 30, 2023 FINDINGS: Lower chest: Hepatobiliary: Comparison with prior examinations demonstrates no change in the previously described 6.5 x 5.5 cm cystic appearing mass within the right posterolateral region of the right lobe of the liver stable since prior examination likely consistent with an exophytic cyst. Prior cholecystectomy without biliary dilatation. Pancreas: Unremarkable. No pancreatic ductal dilatation or surrounding  inflammatory changes. Spleen: Normal in size without focal abnormality. Adrenals/Urinary Tract: Adrenal glands are unremarkable. Kidneys are normal, without renal calculi, focal lesion, or hydronephrosis. Bladder is unremarkable. Stomach/Bowel: Stomach is within normal limits. Appendix appears normal. No evidence of bowel wall thickening, distention, or inflammatory changes. No evidence of appendicitis. No diverticulitis. Vascular/Lymphatic: No significant vascular findings are present. No enlarged abdominal or pelvic lymph nodes. Reproductive: Status post hysterectomy. No adnexal masses. Other: No abdominal wall hernia or abnormality. No abdominopelvic ascites. Musculoskeletal: No acute or significant osseous findings. IMPRESSION: *No acute findings in the abdomen or pelvis. *Stable 6.5 x 5.5 cm cystic appearing mass within the right posterolateral region of the right lobe of the liver likely consistent with an  exophytic cyst. *Prior cholecystectomy without biliary dilatation. *Status post hysterectomy. No adnexal masses. Electronically Signed   By: Fredrich Jefferson M.D.   On: 09/08/2023 08:35        Scheduled Meds:  azithromycin   500 mg Oral Daily   enoxaparin (LOVENOX) injection  40 mg Subcutaneous Q24H   montelukast   10 mg Oral QHS   pantoprazole  (PROTONIX ) IV  40 mg Intravenous Q24H   potassium chloride  40 mEq Oral Once   Continuous Infusions:  sodium chloride  100 mL/hr at 09/09/23 1035     LOS: 0 days    Time spent: 45 minutes spent on chart review, discussion with nursing staff, consultants, updating family and interview/physical exam; more than 50% of that time was spent in counseling and/or coordination of care.    Enrigue Harvard, DO [facility] Available via Epic secure chat 7am-7pm After these hours, please refer to coverage provider listed on [email or web address] 09/09/2023, 12:00 PM

## 2023-09-09 NOTE — Plan of Care (Signed)
   Problem: Education: Goal: Knowledge of General Education information will improve Description Including pain rating scale, medication(s)/side effects and non-pharmacologic comfort measures Outcome: Progressing

## 2023-09-10 LAB — BASIC METABOLIC PANEL WITH GFR
Anion gap: 3 — ABNORMAL LOW (ref 5–15)
BUN: <5 mg/dL — ABNORMAL LOW (ref 6–20)
CO2: 23 mmol/L (ref 22–32)
Calcium: 8.1 mg/dL — ABNORMAL LOW (ref 8.9–10.3)
Chloride: 112 mmol/L — ABNORMAL HIGH (ref 98–111)
Creatinine, Ser: 0.47 mg/dL (ref 0.44–1.00)
GFR, Estimated: >60 mL/min (ref >60)
Glucose, Bld: 95 mg/dL (ref 70–99)
Potassium: 3.4 mmol/L — ABNORMAL LOW (ref 3.5–5.1)
Sodium: 138 mmol/L (ref 135–145)

## 2023-09-10 LAB — CBC
HCT: 32.5 % — ABNORMAL LOW (ref 36.0–46.0)
Hemoglobin: 10.8 g/dL — ABNORMAL LOW (ref 12.0–15.0)
MCH: 31 pg (ref 26.0–34.0)
MCHC: 33.2 g/dL (ref 30.0–36.0)
MCV: 93.4 fL (ref 80.0–100.0)
Platelets: 204 10*3/uL (ref 150–400)
RBC: 3.48 MIL/uL — ABNORMAL LOW (ref 3.87–5.11)
RDW: 12.5 % (ref 11.5–15.5)
WBC: 4.5 10*3/uL (ref 4.0–10.5)
nRBC: 0 % (ref 0.0–0.2)

## 2023-09-10 MED ORDER — AZITHROMYCIN 500 MG PO TABS: 500.0000 mg | ORAL_TABLET | Freq: Every day | ORAL | 0 refills | Status: DC

## 2023-09-10 MED ORDER — SODIUM CHLORIDE 0.9 % IV BOLUS
500.0000 mL | Freq: Once | INTRAVENOUS | Status: AC
Start: 2023-09-10 — End: 2023-09-10
  Administered 2023-09-10: 500 mL via INTRAVENOUS

## 2023-09-10 MED ORDER — LOPERAMIDE HCL 2 MG PO CAPS
2.0000 mg | ORAL_CAPSULE | Freq: Once | ORAL | Status: AC
  Administered 2023-09-10: 2 mg via ORAL
  Filled 2023-09-10: qty 1

## 2023-09-10 MED ORDER — POTASSIUM CHLORIDE CRYS ER 20 MEQ PO TBCR
40.0000 meq | EXTENDED_RELEASE_TABLET | Freq: Once | ORAL | Status: AC
  Administered 2023-09-10: 40 meq via ORAL
  Filled 2023-09-10: qty 2

## 2023-09-10 MED ORDER — SODIUM CHLORIDE 0.9 % IV SOLN: INTRAVENOUS | Status: DC

## 2023-09-10 NOTE — Discharge Summary (Signed)
 Physician Discharge Summary  Brooke Haas NFA:[id number] DOB: 05/09/1970 DOA: 09/08/2023  PCP: Orin Birk, MD  Admit date: 09/08/2023 Discharge date: 09/10/2023  Admitted From: home Discharge disposition: home   Recommendations for Outpatient Follow-Up:   CMP at next office vivist   Discharge Diagnosis:   Principal Problem:   Intractable vomiting Active Problems:   Intractable nausea and vomiting    Discharge Condition: Improved.  Diet recommendation:  Regular.  Wound care: None.  Code status: Full.   History of Present Illness:   Brooke Haas is a 53 y.o. female with medical history significant for migraine headaches, hyperlipidemia and known liver cyst felt to be causing chronic abdominal pain who is being admitted to the hospital with 48 hours of intractable nausea, vomiting and diarrhea.  She denies any chest pain, fevers or chills.  She has been having some chronic abdominal pain and associated elevated liver enzymes for the last couple of months, has been followed by [facility] gastroenterology and there is a plan for drainage of an exophytic liver cyst.  Unfortunately over the last couple of days, she has had onset of intractable nausea and vomiting.  Found to have Campylobacter.     Hospital Course by Problem:   Campylobacter enteritis - Azithromycin  started 5/14 x 5 days -As needed nausea medicine   Hypokalemia - Repleted    Medical Consultants:      Discharge Exam:   Vitals:   09/10/23 0529 09/10/23 0848  BP: (!) 90/47   Pulse: 71   Resp: 17   Temp: 98.2 F (36.8 C)   SpO2:  100%   Vitals:   09/10/23 0022 09/10/23 0130 09/10/23 0529 09/10/23 0848  BP: (!) 93/56 (!) 105/50 (!) 90/47   Pulse:   71   Resp:   17   Temp:   98.2 F (36.8 C)   TempSrc:   Oral   SpO2:    100%  Weight:      Height:        General exam: Appears calm and comfortable.     The results of significant diagnostics from this  hospitalization (including imaging, microbiology, ancillary and laboratory) are listed below for reference.     Procedures and Diagnostic Studies:   CT ABDOMEN PELVIS W CONTRAST Result Date: 09/08/2023 CLINICAL DATA:  Abdominal pain acute, nonlocalized EXAM: CT ABDOMEN AND PELVIS WITH CONTRAST TECHNIQUE: Multidetector CT imaging of the abdomen and pelvis was performed using the standard protocol following bolus administration of intravenous contrast. RADIATION DOSE REDUCTION: This exam was performed according to the departmental dose-optimization program which includes automated exposure control, adjustment of the mA and/or kV according to patient size and/or use of iterative reconstruction technique. CONTRAST:  OMNIPAQUE  IOHEXOL  300 MG/ML  SOLN COMPARISON:  July 30, 2023 FINDINGS: Lower chest: Hepatobiliary: Comparison with prior examinations demonstrates no change in the previously described 6.5 x 5.5 cm cystic appearing mass within the right posterolateral region of the right lobe of the liver stable since prior examination likely consistent with an exophytic cyst. Prior cholecystectomy without biliary dilatation. Pancreas: Unremarkable. No pancreatic ductal dilatation or surrounding inflammatory changes. Spleen: Normal in size without focal abnormality. Adrenals/Urinary Tract: Adrenal glands are unremarkable. Kidneys are normal, without renal calculi, focal lesion, or hydronephrosis. Bladder is unremarkable. Stomach/Bowel: Stomach is within normal limits. Appendix appears normal. No evidence of bowel wall thickening, distention, or inflammatory changes. No evidence of appendicitis. No diverticulitis. Vascular/Lymphatic: No significant vascular findings are present. No enlarged  abdominal or pelvic lymph nodes. Reproductive: Status post hysterectomy. No adnexal masses. Other: No abdominal wall hernia or abnormality. No abdominopelvic ascites. Musculoskeletal: No acute or significant osseous findings.  IMPRESSION: *No acute findings in the abdomen or pelvis. *Stable 6.5 x 5.5 cm cystic appearing mass within the right posterolateral region of the right lobe of the liver likely consistent with an exophytic cyst. *Prior cholecystectomy without biliary dilatation. *Status post hysterectomy. No adnexal masses. Electronically Signed   By: Fredrich Jefferson M.D.   On: 09/08/2023 08:35     Labs:   Basic Metabolic Panel: Recent Labs  Lab 09/08/23 0620 09/09/23 0545 09/10/23 0505  NA 135 140 138  K 3.7 3.2* 3.4*  CL 106 114* 112*  CO2 19* 19* 23  GLUCOSE 86 97 95  BUN 16 6 <5*  CREATININE 0.87 0.52 0.47  CALCIUM 9.1 7.8* 8.1*   GFR Estimated Creatinine Clearance: 73.2 mL/min (by C-G formula based on SCr of 0.47 mg/dL). Liver Function Tests: Recent Labs  Lab 09/08/23 0620  AST 53*  ALT 60*  ALKPHOS 124  BILITOT 1.0  PROT 7.2  ALBUMIN 4.2   Recent Labs  Lab 09/08/23 0620  LIPASE 33   No results for input(s): "AMMONIA" in the last 168 hours. Coagulation profile No results for input(s): "INR", "PROTIME" in the last 168 hours.  CBC: Recent Labs  Lab 09/08/23 0620 09/09/23 0545 09/10/23 0505  WBC 12.8* 5.7 4.5  NEUTROABS 10.7*  --   --   HGB 14.3 11.0* 10.8*  HCT 44.3 34.5* 32.5*  MCV 94.9 97.7 93.4  PLT 323 200 204   Cardiac Enzymes: No results for input(s): "CKTOTAL", "CKMB", "CKMBINDEX", "TROPONINI" in the last 168 hours. BNP: Invalid input(s): "POCBNP" CBG: No results for input(s): "GLUCAP" in the last 168 hours. D-Dimer No results for input(s): "DDIMER" in the last 72 hours. Hgb A1c No results for input(s): "HGBA1C" in the last 72 hours. Lipid Profile No results for input(s): "CHOL", "HDL", "LDLCALC", "TRIG", "CHOLHDL", "LDLDIRECT" in the last 72 hours. Thyroid  function studies No results for input(s): "TSH", "T4TOTAL", "T3FREE", "THYROIDAB" in the last 72 hours.  Invalid input(s): "FREET3" Anemia work up No results for input(s): "VITAMINB12", "FOLATE",  "FERRITIN", "TIBC", "IRON", "RETICCTPCT" in the last 72 hours. Microbiology Recent Results (from the past 240 hours)  Gastrointestinal Panel by PCR , Stool     Status: Abnormal   Collection Time: 09/08/23 11:32 AM   Specimen: Stool  Result Value Ref Range Status   Campylobacter species DETECTED (A) NOT DETECTED Final    Comment: RESULT CALLED TO, READ BACK BY AND VERIFIED WITH: KIRISTIN RYAN RN @0058  09/09/23 ASW    Plesimonas shigelloides NOT DETECTED NOT DETECTED Final   Salmonella species NOT DETECTED NOT DETECTED Final   Yersinia enterocolitica NOT DETECTED NOT DETECTED Final   Vibrio species NOT DETECTED NOT DETECTED Final   Vibrio cholerae NOT DETECTED NOT DETECTED Final   Enteroaggregative E coli (EAEC) NOT DETECTED NOT DETECTED Final   Enteropathogenic E coli (EPEC) NOT DETECTED NOT DETECTED Final   Enterotoxigenic E coli (ETEC) NOT DETECTED NOT DETECTED Final   Shiga like toxin producing E coli (STEC) NOT DETECTED NOT DETECTED Final   Shigella/Enteroinvasive E coli (EIEC) NOT DETECTED NOT DETECTED Final   Cryptosporidium NOT DETECTED NOT DETECTED Final   Cyclospora cayetanensis NOT DETECTED NOT DETECTED Final   Entamoeba histolytica NOT DETECTED NOT DETECTED Final   Giardia lamblia NOT DETECTED NOT DETECTED Final   Adenovirus F40/41 NOT DETECTED NOT DETECTED  Final   Astrovirus NOT DETECTED NOT DETECTED Final   Norovirus GI/GII NOT DETECTED NOT DETECTED Final   Rotavirus A NOT DETECTED NOT DETECTED Final   Sapovirus (I, II, IV, and V) NOT DETECTED NOT DETECTED Final    Comment: Performed at [facility], [address] [place], [place] [address]  C Difficile Quick Screen w PCR reflex     Status: None   Collection Time: 09/08/23 11:32 AM   Specimen: STOOL  Result Value Ref Range Status   C Diff antigen NEGATIVE NEGATIVE Final   C Diff toxin NEGATIVE NEGATIVE Final   C Diff interpretation No C. difficile detected.  Final    Comment: Performed at [facility], [address], [place], [place] [address]     Discharge Instructions:   Discharge Instructions     Diet general   Complete by: As directed    Increase activity slowly   Complete by: As directed       Allergies as of 09/10/2023       Reactions   Aleve [naproxen Sodium] Anaphylaxis        Medication List     TAKE these medications    azithromycin  500 MG tablet Commonly known as: ZITHROMAX  Take 1 tablet (500 mg total) by mouth daily. Start taking on: Sep 11, 2023   Breztri Aerosphere 160-9-4.8 MCG/ACT Aero inhaler Generic drug: budesonide -glycopyrrolate-formoterol  Inhale 2 puffs into the lungs in the morning and at bedtime.   Cholecalciferol 100 MCG (4000 UT) Caps Take 4,000 Units by mouth daily at 12 noon.   CoQ10 200 MG Caps Take 1 capsule by mouth daily at 12 noon.   dicyclomine 20 MG tablet Commonly known as: BENTYL Take 20 mg by mouth 3 (three) times daily as needed for spasms.   estradiol 0.1 MG/GM vaginal cream Commonly known as: ESTRACE Place 1 Applicatorful vaginally as needed (dryness).   famotidine  40 MG tablet Commonly known as: PEPCID  TAKE 1 TABLET BY MOUTH EVERY EVENING What changed: when to take this   FLONASE NA Place 1 spray into the nose 2 (two) times daily as needed (allergies).   levalbuterol  45 MCG/ACT inhaler Commonly known as: XOPENEX  HFA Inhale 2 puffs into the lungs every 6 (six) hours as needed for wheezing or shortness of breath.   montelukast  10 MG tablet Commonly known as: SINGULAIR  Take 1 tablet (10 mg total) by mouth at bedtime.   multivitamin capsule Take 1 capsule by mouth daily.   naratriptan 2.5 MG tablet Commonly known as: AMERGE Take 2.5 mg by mouth as needed for migraine. Take one (1) tablet at onset of headache; if returns or does not resolve, may repeat after 4 hours; do not exceed five (5) mg in 24 hours.   ondansetron  4 MG disintegrating tablet Commonly known as: ZOFRAN -ODT Take 2  mg by mouth every 6 (six) hours as needed for nausea, vomiting or refractory nausea / vomiting.   pantoprazole  40 MG tablet Commonly known as: PROTONIX  Take 40 mg by mouth daily.   rosuvastatin 5 MG tablet Commonly known as: CRESTOR Take 5 mg by mouth daily.   topiramate 50 MG tablet Commonly known as: TOPAMAX Take 100 mg by mouth at bedtime.        Follow-up Information     Orin Birk, MD Follow up.   Specialty: Family Medicine Contact information: [address] [place] [address] [phone]  Time coordinating discharge: 45 min  Signed:  Enrigue Harvard DO  [facility] 09/10/2023, 10:35 AM

## 2023-09-10 NOTE — TOC Initial Note (Signed)
 Transition of Care [facility]) - Initial/Assessment Note    Patient Details  Name: Brooke Haas MRN: [id number] Date of Birth: Jan 26, 1971  [facility] CM/SW Contact:    Ruben Corolla, RN Phone Number: 09/10/2023, 11:55 AM  Clinical Narrative: d/c plan home.                  Expected Discharge Plan: Home/Self Care Barriers to Discharge: Continued Medical Work up   Patient Goals and CMS Choice   CMS Medicare.gov Compare Post Acute Care list provided to:: Patient Choice offered to / list presented to : [name] ownership interest in [facility].provided to:: Patient    Expected Discharge Plan and Services   Discharge Planning Services: CM Consult   Living arrangements for the past 2 months: Single Family Home Expected Discharge Date: 09/10/23                                    Prior Living Arrangements/Services Living arrangements for the past 2 months: Single Family Home   Patient language and need for interpreter reviewed:: Yes              Criminal Activity/Legal Involvement Pertinent to Current Situation/Hospitalization: No - Comment as needed  Activities of Daily Living   ADL Screening (condition at time of admission) Independently performs ADLs?: Yes (appropriate for developmental age) Is the patient deaf or have difficulty hearing?: No Does the patient have difficulty seeing, even when wearing glasses/contacts?: No Does the patient have difficulty concentrating, remembering, or making decisions?: No  Permission Sought/Granted Permission sought to share information with : Case Manager Permission granted to share information with : Yes, Verbal Permission Granted  Share Information with NAME: Case manager           Emotional Assessment              Admission diagnosis:  Intractable vomiting [R11.10] Intractable nausea and vomiting [R11.2] Patient Active Problem List   Diagnosis Date Noted    Intractable nausea and vomiting 09/09/2023   Intractable vomiting 09/08/2023   Acute bacterial rhinosinusitis 02/28/2022   Upper airway cough syndrome 02/28/2022   Seasonal allergies 08/23/2020   Asthma 08/23/2020   GERD (gastroesophageal reflux disease) 08/23/2020   PCP:  Orin Birk, MD Pharmacy:   [facility] [place], [place] - [address] [place] [address] Phone: [phone] Fax: [phone]  [facility] [place] - [address] [place] [address] Phone: [phone] Fax: [phone]  [facility] [place], [place] - [address] [address]  [address] [place] [address] Phone: [phone] Fax: [phone]     Social Drivers of Health (SDOH) Social History: SDOH Screenings   Food Insecurity: No Food Insecurity (09/08/2023)  Housing: Low Risk  (09/08/2023)  Transportation Needs: No Transportation Needs (09/08/2023)  Utilities: Not At Risk (09/08/2023)  Tobacco Use: Low Risk  (09/08/2023)   SDOH Interventions:     Readmission Risk Interventions     No data to display

## 2023-09-15 DIAGNOSIS — A09 Infectious gastroenteritis and colitis, unspecified: Secondary | ICD-10-CM | POA: Diagnosis not present

## 2023-09-21 ENCOUNTER — Encounter: Payer: Self-pay | Admitting: Gastroenterology

## 2023-09-21 DIAGNOSIS — K7689 Other specified diseases of liver: Secondary | ICD-10-CM

## 2023-09-23 LAB — MISCELLANEOUS TEST

## 2023-09-28 NOTE — Telephone Encounter (Signed)
 IR consult for liver cyst drainage by Dr Nereida Banning Pl send fluid for cell count, cytology, culture Check CBC, CMP, INR prior  RG

## 2023-09-29 ENCOUNTER — Other Ambulatory Visit: Payer: Self-pay

## 2023-09-29 DIAGNOSIS — K7689 Other specified diseases of liver: Secondary | ICD-10-CM

## 2023-09-29 NOTE — Telephone Encounter (Signed)
 Please see note below. Please advise if you are requesting the US  liver cyst drainage or are you requesting a IR consult with Dr. Nereida Banning at his office.

## 2023-09-30 ENCOUNTER — Encounter: Payer: Self-pay | Admitting: Gastroenterology

## 2023-10-02 ENCOUNTER — Other Ambulatory Visit: Payer: Self-pay

## 2023-10-02 DIAGNOSIS — K7689 Other specified diseases of liver: Secondary | ICD-10-CM

## 2023-10-05 ENCOUNTER — Other Ambulatory Visit: Payer: Self-pay

## 2023-10-05 NOTE — Telephone Encounter (Signed)
 US  was ordered for the Pt. IR reached out to the pt and scheduled the procedure. Pt was made aware and stated that she understood the instructions. Pt was scheduled for 11/04/2023 at 1:00 PM Pt was made aware also that Dr. Venice Gillis was requesting labs to be done prior. Pt notified to come to our lab on 11/02/2023 to have the labs done. Location to lab provided.  Pt verbalized understanding with all questions answered.

## 2023-10-05 NOTE — Progress Notes (Signed)
 Rosetta Cons, MD  Joelle Musca; P Ir Procedure Requests PROCEDURE / BIOPSY REVIEW Date: 10/02/23  Requested Biopsy site: Liver Reason for request: liver cyst aspiration Imaging review: Best seen on CT  Decision: Approved Imaging modality to perform: Ultrasound Schedule with: No sedation / Local anesthetic Schedule for: Any VIR  Additional comments: @Schedulers .   Please contact me with questions, concerns, or if issue pertaining to this request arise.  Rosetta Cons, MD [facility]       Previous Messages    ----- Message ----- From: Maleigha Colvard Sent: 10/02/2023   1:16 PM EDT To: Panagiotis Oelkers; Ir Procedure Requests Subject: US  fine needle asp 1st lesion                  Procedure: US  fine needle asp 1st lesion  Reason : Liver Cyst Dx: Liver cyst [K76.89 (ICD-10-CM)]  History : Ct abdomen pelvis w/ . MR abd mrcp w/wp , us  abdomen limited ruq     Provider : Lajuan Pila, MD  Contact : [phone]

## 2023-10-15 DIAGNOSIS — N3000 Acute cystitis without hematuria: Secondary | ICD-10-CM | POA: Diagnosis not present

## 2023-10-15 DIAGNOSIS — E7800 Pure hypercholesterolemia, unspecified: Secondary | ICD-10-CM | POA: Diagnosis not present

## 2023-10-15 DIAGNOSIS — J4540 Moderate persistent asthma, uncomplicated: Secondary | ICD-10-CM | POA: Diagnosis not present

## 2023-10-15 DIAGNOSIS — D559 Anemia due to enzyme disorder, unspecified: Secondary | ICD-10-CM | POA: Diagnosis not present

## 2023-10-15 DIAGNOSIS — G43009 Migraine without aura, not intractable, without status migrainosus: Secondary | ICD-10-CM | POA: Diagnosis not present

## 2023-10-15 DIAGNOSIS — K219 Gastro-esophageal reflux disease without esophagitis: Secondary | ICD-10-CM | POA: Diagnosis not present

## 2023-10-15 DIAGNOSIS — R350 Frequency of micturition: Secondary | ICD-10-CM | POA: Diagnosis not present

## 2023-10-15 DIAGNOSIS — J4550 Severe persistent asthma, uncomplicated: Secondary | ICD-10-CM | POA: Diagnosis not present

## 2023-11-02 ENCOUNTER — Other Ambulatory Visit (INDEPENDENT_AMBULATORY_CARE_PROVIDER_SITE_OTHER)

## 2023-11-02 DIAGNOSIS — K7689 Other specified diseases of liver: Secondary | ICD-10-CM | POA: Diagnosis not present

## 2023-11-02 LAB — CBC WITH DIFFERENTIAL/PLATELET
Basophils Absolute: 0 K/uL (ref 0.0–0.1)
Basophils Relative: 0.8 % (ref 0.0–3.0)
Eosinophils Absolute: 0.1 K/uL (ref 0.0–0.7)
Eosinophils Relative: 1.5 % (ref 0.0–5.0)
HCT: 39.8 % (ref 36.0–46.0)
Hemoglobin: 13.4 g/dL (ref 12.0–15.0)
Lymphocytes Relative: 37.9 % (ref 12.0–46.0)
Lymphs Abs: 1.6 K/uL (ref 0.7–4.0)
MCHC: 33.7 g/dL (ref 30.0–36.0)
MCV: 91.1 fl (ref 78.0–100.0)
Monocytes Absolute: 0.4 K/uL (ref 0.1–1.0)
Monocytes Relative: 9.7 % (ref 3.0–12.0)
Neutro Abs: 2.1 K/uL (ref 1.4–7.7)
Neutrophils Relative %: 50.1 % (ref 43.0–77.0)
Platelets: 251 K/uL (ref 150.0–400.0)
RBC: 4.37 Mil/uL (ref 3.87–5.11)
RDW: 13.6 % (ref 11.5–15.5)
WBC: 4.2 K/uL (ref 4.0–10.5)

## 2023-11-02 LAB — COMPREHENSIVE METABOLIC PANEL WITH GFR
ALT: 53 U/L — ABNORMAL HIGH (ref 0–35)
AST: 54 U/L — ABNORMAL HIGH (ref 0–37)
Albumin: 4.4 g/dL (ref 3.5–5.2)
Alkaline Phosphatase: 78 U/L (ref 39–117)
BUN: 12 mg/dL (ref 6–23)
CO2: 24 meq/L (ref 19–32)
Calcium: 9.1 mg/dL (ref 8.4–10.5)
Chloride: 110 meq/L (ref 96–112)
Creatinine, Ser: 0.66 mg/dL (ref 0.40–1.20)
GFR: 100.19 mL/min (ref >60.00)
Glucose, Bld: 90 mg/dL (ref 70–99)
Potassium: 4.1 meq/L (ref 3.5–5.1)
Sodium: 141 meq/L (ref 135–145)
Total Bilirubin: 0.4 mg/dL (ref 0.2–1.2)
Total Protein: 6.5 g/dL (ref 6.0–8.3)

## 2023-11-02 LAB — PROTIME-INR
INR: 1.1 ratio — ABNORMAL HIGH (ref 0.8–1.0)
Prothrombin Time: 11.2 s (ref 9.6–13.1)

## 2023-11-04 ENCOUNTER — Ambulatory Visit: Payer: Self-pay | Admitting: Gastroenterology

## 2023-11-04 ENCOUNTER — Other Ambulatory Visit (HOSPITAL_COMMUNITY): Payer: Self-pay | Admitting: Interventional Radiology

## 2023-11-04 ENCOUNTER — Ambulatory Visit (HOSPITAL_COMMUNITY)
Admission: RE | Admit: 2023-11-04 | Discharge: 2023-11-04 | Disposition: A | Discharge status: Home / Self Care | Source: Ambulatory Visit | Attending: Gastroenterology | Admitting: Gastroenterology

## 2023-11-04 DIAGNOSIS — K7689 Other specified diseases of liver: Secondary | ICD-10-CM

## 2023-11-04 MED ORDER — LIDOCAINE HCL (PF) 1 % IJ SOLN
8.0000 mL | Freq: Once | INTRAMUSCULAR | Status: AC
  Administered 2023-11-04: 8 mL via INTRADERMAL

## 2023-11-04 NOTE — Procedures (Signed)
 Interventional Radiology Procedure Note  Procedure: Ultrasound guided liver cyst aspiration  Findings: Please refer to procedural dictation for full description. Approximately 10 mL of very viscous, slightly opaque, straw-color fluid aspirated.  Sample sent for cytology.  Complications: None immediate  Estimated Blood Loss: < 5 ml  Recommendations: Follow cytology results. IR will arrange for additional aspiration/sclerotherapy with moderate sedation.   Ester Sides, MD

## 2023-11-05 LAB — CYTOLOGY - NON PAP

## 2023-11-24 ENCOUNTER — Other Ambulatory Visit (HOSPITAL_COMMUNITY): Payer: Self-pay | Admitting: Radiology

## 2023-11-24 DIAGNOSIS — K7689 Other specified diseases of liver: Secondary | ICD-10-CM

## 2023-11-25 ENCOUNTER — Ambulatory Visit (HOSPITAL_COMMUNITY): Admission: RE | Admit: 2023-11-25 | Source: Ambulatory Visit

## 2023-11-25 ENCOUNTER — Encounter (HOSPITAL_COMMUNITY): Payer: Self-pay

## 2023-12-07 ENCOUNTER — Encounter (HOSPITAL_COMMUNITY): Payer: Self-pay | Admitting: Radiology

## 2023-12-08 ENCOUNTER — Other Ambulatory Visit: Payer: Self-pay | Admitting: Student

## 2023-12-08 DIAGNOSIS — K7689 Other specified diseases of liver: Secondary | ICD-10-CM

## 2023-12-08 DIAGNOSIS — Z01818 Encounter for other preprocedural examination: Secondary | ICD-10-CM

## 2023-12-08 NOTE — H&P (Signed)
 Chief Complaint: Symptomatic hepatic cyst. Request is for sclerotherapy.  Referring Physician(s): Dr. Lynnie Bring  Supervising Physician: Jennefer Rover  Patient Status: [facility] - Out-pt  History of Present Illness: Brooke Haas is a 53 y.o. female outpatient. Known to IR. History of GERD, HLD. Symptomatic right hepatic cyst. IR attempted a hepatic cyst aspiration on 7.9.25 and found the output to be thick and gelatinous. Patient presents for sclerotherapy with conscious sedation.  Patient alert and laying in bed,calm. Endorses nausea which she took 4 mg of zofran  ODT for at home. Denies any fevers, headache, chest pain, SOB, cough, abdominal pain,vomiting or bleeding.   All labs and medications are within acceptable parameters. No pertinent allergies. Patient has been NPO since midnight.   Return precautions and treatment recommendations and follow-up discussed with the patient  who is agreeable with the plan.    Past Medical History:  Diagnosis Date   Allergy    seasonal   Anemia    years ago prior to hysterectomy   Asthma    Breast mass, right    Breast mass, right    COVID-19 virus infection 06/16/2021   GERD (gastroesophageal reflux disease)    Headache    Hyperlipidemia     Past Surgical History:  Procedure Laterality Date   ABDOMINAL HYSTERECTOMY     BREAST BIOPSY Left    BREAST EXCISIONAL BIOPSY Right    CESAREAN SECTION     x 3   CHOLECYSTECTOMY     COLONOSCOPY     ESOPHAGOGASTRODUODENOSCOPY  12/11/2004   Mild gastritis. Otherwise normal EGD   RADIOACTIVE SEED GUIDED EXCISIONAL BREAST BIOPSY Right 12/01/2016   Procedure: RIGHT RADIOACTIVE SEED GUIDED EXCISIONAL BREAST BIOPSY;  Surgeon: Ebbie Cough, MD;  Location: [facility];  Service: General;  Laterality: Right;   ROTATOR CUFF REPAIR Right    SINOSCOPY     TONSILLECTOMY     UPPER GASTROINTESTINAL ENDOSCOPY      Allergies: Aleve [naproxen  sodium]  Medications: Prior to Admission medications   Medication Sig Start Date End Date Taking? Authorizing Provider  azithromycin  (ZITHROMAX ) 500 MG tablet Take 1 tablet (500 mg total) by mouth daily. 09/11/23   Vann, Jessica U, DO  BREZTRI  AEROSPHERE 160-9-4.8 MCG/ACT AERO Inhale 2 puffs into the lungs in the morning and at bedtime. 12/31/21   [provider]  Cholecalciferol 100 MCG (4000 UT) CAPS Take 4,000 Units by mouth daily at 12 noon.    [provider]  Coenzyme Q10 (COQ10) 200 MG CAPS Take 1 capsule by mouth daily at 12 noon.    [provider]  dicyclomine (BENTYL) 20 MG tablet Take 20 mg by mouth 3 (three) times daily as needed for spasms. 07/21/23   [provider]  estradiol (ESTRACE) 0.1 MG/GM vaginal cream Place 1 Applicatorful vaginally as needed (dryness). 01/04/20   [provider]  famotidine  (PEPCID ) 40 MG tablet TAKE 1 TABLET BY MOUTH EVERY EVENING Patient taking differently: Take 40 mg by mouth at bedtime. 05/22/21   Kozlow, Eric J, MD  Fluticasone Propionate (FLONASE NA) Place 1 spray into the nose 2 (two) times daily as needed (allergies).    [provider]  levalbuterol  (XOPENEX  HFA) 45 MCG/ACT inhaler Inhale 2 puffs into the lungs every 6 (six) hours as needed for wheezing or shortness of breath. 12/05/22 12/05/23  Neda Jennet LABOR, MD  montelukast  (SINGULAIR ) 10 MG tablet Take 1 tablet (10 mg total) by mouth at bedtime. 08/24/23   Neda Jennet  A, MD  Multiple Vitamin (MULTIVITAMIN) capsule Take 1 capsule by mouth daily.    [provider]  naratriptan (AMERGE) 2.5 MG tablet Take 2.5 mg by mouth as needed for migraine. Take one (1) tablet at onset of headache; if returns or does not resolve, may repeat after 4 hours; do not exceed five (5) mg in 24 hours.    [provider]  ondansetron  (ZOFRAN -ODT) 4 MG disintegrating tablet Take 2 mg by mouth every 6 (six) hours as needed for nausea, vomiting or  refractory nausea / vomiting. 07/21/23   [provider]  pantoprazole  (PROTONIX ) 40 MG tablet Take 40 mg by mouth daily. 12/23/21   [provider]  rosuvastatin (CRESTOR) 5 MG tablet Take 5 mg by mouth daily.    [provider]  topiramate  (TOPAMAX ) 50 MG tablet Take 100 mg by mouth at bedtime. 10/17/21   [provider]     Family History  Problem Relation Age of Onset   Allergic rhinitis Mother    Breast cancer Mother 10   Allergic rhinitis Father    Allergic rhinitis Sister    Asthma Sister    Breast cancer Paternal Grandmother        7s   Colon cancer Neg Hx    Pancreatic cancer Neg Hx    Liver cancer Neg Hx    Esophageal cancer Neg Hx    Rectal cancer Neg Hx    Colon polyps Neg Hx    Stomach cancer Neg Hx     Social History   Socioeconomic History   Marital status: Married    Spouse name: Not on file   Number of children: Not on file   Years of education: Not on file   Highest education level: Not on file  Occupational History   Occupation: pharmacist    Comment: [employer]  Tobacco Use   Smoking status: Never   Smokeless tobacco: Never  Vaping Use   Vaping status: Never Used  Substance and Sexual Activity   Alcohol  use: Yes    Comment: social   Drug use: No   Sexual activity: Not on file  Other Topics Concern   Not on file  Social History Narrative   Not on file   Social Drivers of Health   Financial Resource Strain: Not on file  Food Insecurity: No Food Insecurity (09/08/2023)   Hunger Vital Sign    Worried About Running Out of Food in the Last Year: Never true    Ran Out of Food in the Last Year: Never true  Transportation Needs: No Transportation Needs (09/08/2023)   PRAPARE - Administrator, Civil Service (Medical): No    Lack of Transportation (Non-Medical): No  Physical Activity: Not on file  Stress: Not on file  Social Connections: Not on file     Review of Systems: A 12 point ROS  discussed and pertinent positives are indicated in the HPI above.  All other systems are negative.  Review of Systems  Constitutional:  Negative for fatigue and fever.  HENT:  Negative for congestion.   Respiratory:  Negative for cough and shortness of breath.   Gastrointestinal:  Positive for nausea. Negative for abdominal pain, diarrhea and vomiting.    Vital Signs: BP 109/63   Pulse 78   Temp 98.1 F (36.7 C) (Oral)   Resp 19   Ht 5' 5 (1.651 m)   Wt 135 lb (61.2 kg)   SpO2 98%  BMI 22.47 kg/m     Physical Exam Vitals and nursing note reviewed.  Constitutional:      Appearance: She is well-developed.  HENT:     Head: Normocephalic and atraumatic.  Eyes:     Conjunctiva/sclera: Conjunctivae normal.  Cardiovascular:     Rate and Rhythm: Regular rhythm.  Pulmonary:     Effort: Pulmonary effort is normal.  Musculoskeletal:        General: Normal range of motion.     Cervical back: Normal range of motion.  Skin:    General: Skin is warm and dry.  Neurological:     General: No focal deficit present.     Mental Status: She is alert and oriented to person, place, and time. Mental status is at baseline.  Psychiatric:        Mood and Affect: Mood normal.        Behavior: Behavior normal.        Thought Content: Thought content normal.        Judgment: Judgment normal.     Imaging: No results found.  Labs:  CBC: Recent Labs    09/09/23 0545 09/10/23 0505 11/02/23 0935 12/09/23 1020  WBC 5.7 4.5 4.2 6.6  HGB 11.0* 10.8* 13.4 13.9  HCT 34.5* 32.5* 39.8 41.8  PLT 200 204 251.0 281    COAGS: Recent Labs    11/02/23 0935 12/09/23 1020  INR 1.1* 0.9    BMP: Recent Labs    07/30/23 1053 09/08/23 0620 09/09/23 0545 09/10/23 0505 11/02/23 0935  NA 141 135 140 138 141  K 4.0 3.7 3.2* 3.4* 4.1  CL 109 106 114* 112* 110  CO2 22 19* 19* 23 24  GLUCOSE 98 86 97 95 90  BUN 22* 16 6 <5* 12  CALCIUM 9.3 9.1 7.8* 8.1* 9.1  CREATININE 0.58 0.87 0.52  0.47 0.66  GFRNONAA >60 >60 >60 >60  --     LIVER FUNCTION TESTS: Recent Labs    07/13/23 1104 07/30/23 1053 09/08/23 0620 11/02/23 0935  BILITOT 0.5 0.7 1.0 0.4  AST 274* 38 53* 54*  ALT 115* 50* 60* 53*  ALKPHOS 79 92 124 78  PROT 7.1 7.6 7.2 6.5  ALBUMIN 4.6 4.3 4.2 4.4    TUMOR MARKERS: No results for input(s): AFPTM, CEA, CA199, CHROMGRNA in the last 8760 hours.  Assessment and Plan:  53 y.o. female outpatient. Known to IR. History of GERD, HLD. Symptomatic right hepatic cyst. IR attempted a hepatic cyst aspiration on 7.9.25 and found the output to be thick and gelatinous. Patient presents for sclerotherapy with conscious sedation.  PLAN: IR Image Guided Sclerotherapy of symptomatic hepatic cyst.   Risks and benefits discussed with the patient including bleeding, infection, damage to adjacent structures, bowel perforation/fistula connection, and sepsis.  All of the patient's questions were answered, patient is agreeable to proceed. Consent signed and in chart.  Thank you for this interesting consult.  I greatly enjoyed meeting Martina Cranford Konecny and look forward to participating in their care.  A copy of this report was sent to the requesting provider on this date.  Electronically Signed: Delon JAYSON Beagle, NP 12/09/2023, 11:02 AM   I spent a total of    15 Minutes in face to face in clinical consultation, greater than 50% of which was counseling/coordinating care for sclerotherapy of symptomatic liver cyst

## 2023-12-09 ENCOUNTER — Ambulatory Visit (HOSPITAL_COMMUNITY)
Admission: RE | Admit: 2023-12-09 | Discharge: 2023-12-09 | Disposition: A | Discharge status: Home / Self Care | Source: Ambulatory Visit | Attending: Interventional Radiology | Admitting: Interventional Radiology

## 2023-12-09 ENCOUNTER — Other Ambulatory Visit: Payer: Self-pay

## 2023-12-09 DIAGNOSIS — Z79899 Other long term (current) drug therapy: Secondary | ICD-10-CM | POA: Diagnosis not present

## 2023-12-09 DIAGNOSIS — K219 Gastro-esophageal reflux disease without esophagitis: Secondary | ICD-10-CM | POA: Insufficient documentation

## 2023-12-09 DIAGNOSIS — K7689 Other specified diseases of liver: Secondary | ICD-10-CM | POA: Insufficient documentation

## 2023-12-09 DIAGNOSIS — E785 Hyperlipidemia, unspecified: Secondary | ICD-10-CM | POA: Diagnosis not present

## 2023-12-09 DIAGNOSIS — Z01818 Encounter for other preprocedural examination: Secondary | ICD-10-CM

## 2023-12-09 HISTORY — PX: IR SCLEROTHERAPY OF A FLUID COLLECTION: IMG6090

## 2023-12-09 LAB — CBC
HCT: 41.8 % (ref 36.0–46.0)
Hemoglobin: 13.9 g/dL (ref 12.0–15.0)
MCH: 31 pg (ref 26.0–34.0)
MCHC: 33.3 g/dL (ref 30.0–36.0)
MCV: 93.1 fL (ref 80.0–100.0)
Platelets: 281 K/uL (ref 150–400)
RBC: 4.49 MIL/uL (ref 3.87–5.11)
RDW: 12.4 % (ref 11.5–15.5)
WBC: 6.6 K/uL (ref 4.0–10.5)
nRBC: 0 % (ref 0.0–0.2)

## 2023-12-09 LAB — PROTIME-INR
INR: 0.9 (ref 0.8–1.2)
Prothrombin Time: 13.2 s (ref 11.4–15.2)

## 2023-12-09 MED ORDER — ONDANSETRON HCL 4 MG/2ML IJ SOLN
INTRAMUSCULAR | Status: AC | PRN
Start: 2023-12-09 — End: 2023-12-09
  Administered 2023-12-09 (×2): 4 mg via INTRAVENOUS

## 2023-12-09 MED ORDER — SODIUM CHLORIDE 0.9 % IV SOLN: INTRAVENOUS | Status: DC

## 2023-12-09 MED ORDER — OXYCODONE HCL 5 MG PO TABS: 5.0000 mg | ORAL_TABLET | ORAL | 0 refills | Status: AC | PRN

## 2023-12-09 MED ORDER — FENTANYL CITRATE (PF) 100 MCG/2ML IJ SOLN
INTRAMUSCULAR | Status: AC
  Filled 2023-12-09: qty 2

## 2023-12-09 MED ORDER — MIDAZOLAM HCL 2 MG/2ML IJ SOLN
INTRAMUSCULAR | Status: AC | PRN
  Administered 2023-12-09 (×2): .5 mg via INTRAVENOUS

## 2023-12-09 MED ORDER — MIDAZOLAM HCL 2 MG/2ML IJ SOLN
INTRAMUSCULAR | Status: AC
  Filled 2023-12-09: qty 4

## 2023-12-09 MED ORDER — IOHEXOL 300 MG/ML  SOLN
50.0000 mL | Freq: Once | INTRAMUSCULAR | Status: AC | PRN
  Administered 2023-12-09 (×2): 10 mL

## 2023-12-09 MED ORDER — ALCOHOL (ABLYSINOL) 99% IA SOLN
50.0000 mL | Freq: Once | INTRA_ARTERIAL | Status: DC
  Filled 2023-12-09 (×2): qty 50

## 2023-12-09 MED ORDER — LIDOCAINE-EPINEPHRINE 1 %-1:100000 IJ SOLN
20.0000 mL | Freq: Once | INTRAMUSCULAR | Status: AC
  Administered 2023-12-09 (×2): 10 mL via INTRADERMAL

## 2023-12-09 MED ORDER — FENTANYL CITRATE (PF) 100 MCG/2ML IJ SOLN
INTRAMUSCULAR | Status: AC | PRN
  Administered 2023-12-09 (×2): 50 ug via INTRAVENOUS
  Administered 2023-12-09 (×2): 25 ug via INTRAVENOUS

## 2023-12-09 MED ORDER — LIDOCAINE-EPINEPHRINE 1 %-1:100000 IJ SOLN
INTRAMUSCULAR | Status: AC
  Filled 2023-12-09: qty 1

## 2023-12-09 MED ORDER — MIDAZOLAM HCL 2 MG/2ML IJ SOLN
INTRAMUSCULAR | Status: AC | PRN
Start: 2023-12-09 — End: 2023-12-09
  Administered 2023-12-09: 1 mg via INTRAVENOUS
  Administered 2023-12-09 (×2): .5 mg via INTRAVENOUS
  Administered 2023-12-09: 1 mg via INTRAVENOUS

## 2023-12-09 MED ORDER — ONDANSETRON HCL 4 MG/2ML IJ SOLN
INTRAMUSCULAR | Status: AC
  Filled 2023-12-09: qty 2

## 2023-12-09 MED ORDER — FENTANYL CITRATE (PF) 100 MCG/2ML IJ SOLN
INTRAMUSCULAR | Status: AC | PRN
  Administered 2023-12-09: 25 ug via INTRAVENOUS
  Administered 2023-12-09 (×2): 50 ug via INTRAVENOUS
  Administered 2023-12-09: 25 ug via INTRAVENOUS

## 2023-12-09 NOTE — Discharge Instructions (Signed)
 Drink plenty of fluids over the next 2-3 days.  Puncture Site, Care After These instructions tell you how to care for yourself after your procedure. Your doctor may give you more instructions. Call your doctor if you have any problems or questions. What can I expect after the procedure? After a needle biopsy, it is common to have these things at the puncture site: Soreness. Bruising. Mild pain. These things should go away after a few days. Follow these instructions at home: Puncture site care  Wash your hands with soap and water for at least 20 seconds before and after you change your bandage (dressing). If you cannot use soap and water, use hand sanitizer. Follow instructions from your doctor about how to take care of your puncture site. This includes: Remove Dressing in 24 hours.  You May Shower In 24 Hours. DO NOT SCRUB over site.  Check your puncture site every day for signs of infection. Check for: Redness, swelling, or more pain. Fluid or blood. Warmth. Pus or a bad smell. General instructions Go back to your normal activities when your doctor says that it is safe. Ask if there is anything you cannot do as you heal. Take over-the-counter and prescription medicines only as told by your doctor. Do not take baths, swim, or use a hot tub for at least 5 days.  Keep all follow-up visits. Ask if you need an appointment to get your biopsy results. Contact a doctor if: You have a fever. You have redness, swelling, or more pain at the puncture site, and it lasts longer than a few days. You have fluid, blood, or pus coming from the puncture site. Your puncture site feels warm. Get help right away if: You have very bad bleeding from the puncture site. Summary After the procedure, it is common to have soreness, bruising, or mild pain at the puncture site. Check your puncture site every day for signs of infection, such as redness, swelling, or more pain. Get help right away if you have  very bad bleeding from your puncture site. This information is not intended to replace advice given to you by your health care provider. Make sure you discuss any questions you have with your health care provider. Document Revised: 10/03/2020 Document Reviewed: 10/03/2020 Elsevier Patient Education  2024 [employer].

## 2023-12-09 NOTE — Procedures (Signed)
 Interventional Radiology Procedure Note  Procedure: Ultrasound and fluoroscopic guided aspiration and sclerotherapy of hepatic cyst  Findings: Please refer to procedural dictation for full description. Thick, yellow aspirate, approximately 80 mL removed via 12 Fr drain.  5 mL ethanol injected with approximately 10 minute dwell time.  Drain removed.  Sample sent for culture.  Complications: None immediate  Estimated Blood Loss: < 5 mL  Recommendations: Pain control as needed. Follow up with CT abdomen in 1 month with IR clinic follow up.   Ester Sides, MD

## 2023-12-10 ENCOUNTER — Telehealth (HOSPITAL_COMMUNITY): Payer: Self-pay | Admitting: Student

## 2023-12-10 DIAGNOSIS — K7689 Other specified diseases of liver: Secondary | ICD-10-CM

## 2023-12-10 NOTE — Telephone Encounter (Signed)
 Patient underwent hepatic cyst sclerotherapy with Dr. Jennefer 12/09/23. I called her today to check in on her and she is feeling well. She has no real pain or discomfort at rest but does have some with activity. It is not enough to warrant pain medication. She slept ok last night and has been able to eat and drink a little today.   She will follow up with Dr. Jennefer in one month with a CT abdomen with contrast followed by a clinic visit. The patient is aware of this and knows a scheduler will call her with a date/time of her appointments. She was encouraged to call the clinic if she has any questions/concerns prior to her follow up with Dr. Jennefer.   Nehemiah Montee, AGACNP-BC 12/10/2023, 12:31 PM

## 2023-12-13 NOTE — Progress Notes (Unsigned)
 [facility]  PATIENT: Brooke Haas DOB: [birth year]  REFERRING DOCTOR OR PCP: Harlene Leff, NP SOURCE: Patient, notes from primary care, imaging and lab reports, MRI images personally reviewed.  _________________________________   HISTORICAL  CHIEF COMPLAINT:  Chief Complaint  Patient presents with   New Patient (Initial Visit)    Pt in room 11.alone. Paper referral for small vessel ischemic changes. 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 Pt reports covid fog, states she doesn't process things as she would before.     HISTORY OF PRESENT ILLNESS:  I had the pleasure of seeing your patient, Brooke Haas, at [facility] for neurologic consultation regarding her MRI showing white matter foci.  She is a 53 year old woman with migraine headache, who was experiencing a lot of fatigue.   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   She feels more tired as the day goes on.   She has myalgias and joint pain though rheumatology labs were fine.   She reports brain fog with reduced focus and noting difficulty with word recall.      The cognitive issues fluctuate.  She had COVID-19 in December 2020.  She had loss of smell and taste.  She had a pneumonia requiring antibiotics.  Due to continued SOB, she was on prednisone  and inhalers and saw pulmonology.    Her respiratory issues have done better lately. .    She sleeps well most nights.  She feels frustrated as she is less active but denies actual depression.     I personally reviewed the MRI from 08/28/2023.  It is normal for age just showing 2 or 3 punctate T2/FLAIR hyperintense foci in the deep white matter.  This is most consistent with very minimal chronic microvascular ischemic change or sequela of migraine headache.  Demyelination is less likely to have this pattern.  She has chronic migraine headaches that have generally done well on topiramate  for prophylaxis  and naratriptan for acute treatment.    She has been on topiramate  since about 2021.   Prior to topiramate  she was having about 15-18 migraine days/month, > 4 hours each of those days.   On topiramate  has 3-5/month.     She tried [employer] but had a fever after each of her first 3 doses and needed to stop.    Chronic meds tried: Topiramate :  Brain fog, word finding difficulty Emgality:  Poorly tolerated due to flu like symptoms.  Acute Meds; Imitrex:  Less effective Naratriptan  Some benefit.      REVIEW OF SYSTEMS: Constitutional: No fevers, chills, sweats, or change in appetite Eyes: No visual changes, double vision, eye pain Ear, nose and throat: No hearing loss, ear pain, nasal congestion, sore throat Cardiovascular: No chest pain, palpitations Respiratory:  No shortness of breath at rest or with exertion.   No wheezes GastrointestinaI: No nausea, vomiting, diarrhea, abdominal pain, fecal incontinence Genitourinary:  No dysuria, urinary retention or frequency.  No nocturia. Musculoskeletal:  No neck pain, back pain Integumentary: No rash, pruritus, skin lesions Neurological: as above Psychiatric: No depression at this time.  No anxiety Endocrine: No palpitations, diaphoresis, change in appetite, change in weigh or increased thirst Hematologic/Lymphatic:  No anemia, purpura, petechiae. Allergic/Immunologic: No itchy/runny eyes, nasal congestion, recent allergic reactions, rashes  ALLERGIES: Allergies  Allergen Reactions   Aleve [Naproxen Sodium] Anaphylaxis    HOME MEDICATIONS:  Current Outpatient Medications:    BREZTRI  AEROSPHERE 160-9-4.8 MCG/ACT AERO, Inhale 2  puffs into the lungs in the morning and at bedtime., Disp: , Rfl:    Cholecalciferol 100 MCG (4000 UT) CAPS, Take 4,000 Units by mouth daily at 12 noon., Disp: , Rfl:    Coenzyme Q10 (COQ10) 200 MG CAPS, Take 1 capsule by mouth daily at 12 noon., Disp: , Rfl:    estradiol (ESTRACE) 0.1 MG/GM vaginal cream, Place 1  Applicatorful vaginally as needed (dryness)., Disp: , Rfl:    famotidine  (PEPCID ) 40 MG tablet, TAKE 1 TABLET BY MOUTH EVERY EVENING (Patient taking differently: Take 40 mg by mouth at bedtime.), Disp: 30 tablet, Rfl: 1   Fluticasone Propionate (FLONASE NA), Place 1 spray into the nose 2 (two) times daily as needed (allergies)., Disp: , Rfl:    levalbuterol  (XOPENEX  HFA) 45 MCG/ACT inhaler, Inhale 2 puffs into the lungs every 6 (six) hours as needed for wheezing or shortness of breath., Disp: 1 each, Rfl: 5   montelukast  (SINGULAIR ) 10 MG tablet, Take 1 tablet (10 mg total) by mouth at bedtime., Disp: 30 tablet, Rfl: 3   Multiple Vitamin (MULTIVITAMIN) capsule, Take 1 capsule by mouth daily., Disp: , Rfl:    naratriptan (AMERGE) 2.5 MG tablet, Take 2.5 mg by mouth as needed for migraine. Take one (1) tablet at onset of headache; if returns or does not resolve, may repeat after 4 hours; do not exceed five (5) mg in 24 hours., Disp: , Rfl:    ondansetron  (ZOFRAN -ODT) 4 MG disintegrating tablet, Take 2 mg by mouth every 6 (six) hours as needed for nausea, vomiting or refractory nausea / vomiting., Disp: , Rfl:    oxyCODONE  (ROXICODONE ) 5 MG immediate release tablet, Take 1 tablet (5 mg total) by mouth every 4 (four) hours as needed for up to 5 days for severe pain (pain score 7-10)., Disp: 30 tablet, Rfl: 0   pantoprazole  (PROTONIX ) 40 MG tablet, Take 40 mg by mouth daily., Disp: , Rfl:    Rimegepant Sulfate (NURTEC) 75 MG TBDP, One po every other day, Disp: 15 tablet, Rfl: 11   rosuvastatin (CRESTOR) 5 MG tablet, Take 5 mg by mouth daily., Disp: , Rfl:    topiramate  (TOPAMAX ) 50 MG tablet, Take 100 mg by mouth at bedtime., Disp: , Rfl:    Vitamin D, Ergocalciferol, (DRISDOL) 1.25 MG (50000 UNIT) CAPS capsule, Take 50,000 Units by mouth every 7 (seven) days., Disp: , Rfl:   PAST MEDICAL HISTORY: Past Medical History:  Diagnosis Date   Allergy    seasonal   Anemia    years ago prior to hysterectomy    Asthma    Breast mass, right    Breast mass, right    COVID-19 virus infection 06/16/2021   GERD (gastroesophageal reflux disease)    Headache    Hyperlipidemia     PAST SURGICAL HISTORY: Past Surgical History:  Procedure Laterality Date   ABDOMINAL HYSTERECTOMY     BREAST BIOPSY Left    BREAST EXCISIONAL BIOPSY Right    CESAREAN SECTION     x 3   CHOLECYSTECTOMY     COLONOSCOPY     ESOPHAGOGASTRODUODENOSCOPY  12/11/2004   Mild gastritis. Otherwise normal EGD   IR SCLEROTHERAPY OF A FLUID COLLECTION  12/09/2023   RADIOACTIVE SEED GUIDED EXCISIONAL BREAST BIOPSY Right 12/01/2016   Procedure: RIGHT RADIOACTIVE SEED GUIDED EXCISIONAL BREAST BIOPSY;  Surgeon: Ebbie Cough, MD;  Location: [facility];  Service: General;  Laterality: Right;   ROTATOR CUFF REPAIR Right    SINOSCOPY  TONSILLECTOMY     UPPER GASTROINTESTINAL ENDOSCOPY      FAMILY HISTORY: Family History  Problem Relation Age of Onset   Allergic rhinitis Mother    Breast cancer Mother 67   Allergic rhinitis Father    Allergic rhinitis Sister    Asthma Sister    Breast cancer Paternal Grandmother        64s   Colon cancer Neg Hx    Pancreatic cancer Neg Hx    Liver cancer Neg Hx    Esophageal cancer Neg Hx    Rectal cancer Neg Hx    Colon polyps Neg Hx    Stomach cancer Neg Hx     SOCIAL HISTORY: Social History   Socioeconomic History   Marital status: Married    Spouse name: Not on file   Number of children: Not on file   Years of education: Not on file   Highest education level: Not on file  Occupational History   Occupation: pharmacist    Comment: [employer]  Tobacco Use   Smoking status: Never   Smokeless tobacco: Never  Vaping Use   Vaping status: Never Used  Substance and Sexual Activity   Alcohol  use: Yes    Comment: social   Drug use: No   Sexual activity: Not on file  Other Topics Concern   Not on file  Social History Narrative   Right handed     Drinks 1 cup per day    Patient is a Teacher, early years/pre    Social Drivers of Corporate investment banker Strain: Not on file  Food Insecurity: No Food Insecurity (09/08/2023)   Hunger Vital Sign    Worried About Running Out of Food in the Last Year: Never true    Ran Out of Food in the Last Year: Never true  Transportation Needs: No Transportation Needs (09/08/2023)   PRAPARE - Administrator, Civil Service (Medical): No    Lack of Transportation (Non-Medical): No  Physical Activity: Not on file  Stress: Not on file  Social Connections: Not on file  Intimate Partner Violence: Not At Risk (09/08/2023)   Humiliation, Afraid, Rape, and Kick questionnaire    Fear of Current or Ex-Partner: No    Emotionally Abused: No    Physically Abused: No    Sexually Abused: No       PHYSICAL EXAM  Vitals:   12/14/23 1005  BP: 96/62  Pulse: 81  SpO2: 99%  Weight: 135 lb (61.2 kg)  Height: 5' 5 (1.651 m)    Body mass index is 22.47 kg/m.   General: The patient is well-developed and well-nourished and in no acute distress  HEENT:  Head is Hailey/AT.  Sclera are anicteric.  Funduscopic exam shows normal optic discs and retinal vessels.  Neck: No carotid bruits are noted.  The neck is nontender.  Cardiovascular: The heart has a regular rate and rhythm with a normal S1 and S2. There were no murmurs, gallops or rubs.    Skin: Extremities are without rash or  edema.  Musculoskeletal:  Back is nontender  Neurologic Exam  Mental status: The patient is alert and oriented x 3 at the time of the examination. The patient has apparent normal recent and remote memory, with an apparently normal attention span and concentration ability.   Speech is normal.  Cranial nerves: Extraocular movements are full. Pupils are equal, round, and reactive to light and accomodation.  Visual fields are full.  Facial symmetry  is present. There is good facial sensation to soft touch bilaterally.Facial strength is  normal.  Trapezius and sternocleidomastoid strength is normal. No dysarthria is noted.  The tongue is midline, and the patient has symmetric elevation of the soft palate. No obvious hearing deficits are noted.  Motor:  Muscle bulk is normal.   Tone is normal. Strength is  5 / 5 in all 4 extremities.   Sensory: Sensory testing is intact to pinprick, soft touch and vibration sensation in all 4 extremities.  Coordination: Cerebellar testing reveals good finger-nose-finger and heel-to-shin bilaterally.  Gait and station: Station is normal.   Gait is normal. Tandem gait is normal. Romberg is negative.   Reflexes: Deep tendon reflexes are symmetric and normal bilaterally.   Plantar responses are flexor.    DIAGNOSTIC DATA (LABS, IMAGING, TESTING) - I reviewed patient records, labs, notes, testing and imaging myself where available.  Lab Results  Component Value Date   WBC 6.6 12/09/2023   HGB 13.9 12/09/2023   HCT 41.8 12/09/2023   MCV 93.1 12/09/2023   PLT 281 12/09/2023      Component Value Date/Time   NA 141 11/02/2023 0935   K 4.1 11/02/2023 0935   CL 110 11/02/2023 0935   CO2 24 11/02/2023 0935   GLUCOSE 90 11/02/2023 0935   BUN 12 11/02/2023 0935   CREATININE 0.66 11/02/2023 0935   CALCIUM 9.1 11/02/2023 0935   PROT 6.5 11/02/2023 0935   ALBUMIN 4.4 11/02/2023 0935   AST 54 (H) 11/02/2023 0935   ALT 53 (H) 11/02/2023 0935   ALKPHOS 78 11/02/2023 0935   BILITOT 0.4 11/02/2023 0935   GFRNONAA >60 09/10/2023 0505    Lab Results  Component Value Date   TSH 1.060 07/13/2023       ASSESSMENT AND PLAN  Chronic migraine w/o aura, not intractable, w/o stat migr  White matter abnormality on MRI of brain  Word finding difficulty    In summary, Ms. Roell is a 53 year old woman who has chronic migraine headaches, fatigue and mild cognitive issues since COVID-19 who was found to have a couple punctate T2/FLAIR hyperintense foci in the deep white matter of the  cerebral hemispheres.  I personally reviewed the MRIs.  I do not feel that this represents demyelination.  It is most likely very minimal chronic microvascular ischemic change or sequela of migraine headaches. No further evaluation is necessary as the MRI is normal for age.  I do not think she needs a follow-up MRI She continues to experience migraine headaches.  Topamax  may be causing or contributing to her cognitive fog.  I will switch her to Nurtec 75 mg p.o. every other day.  In the past, she has been on Topamax  for prophylaxis that affect her cognition.  She has also been on Emgality that was poorly tolerated due to flulike symptoms. Stay active and exercise as tolerated. She will follow-up with us  in 6 or 7 months for the migraines.  If she is unable to tolerate Nurtec, consider zonisamide levetiracetam or nortriptyline for migraine prophylaxis.  Thank you for asking me to see Ms. Schaab.  Please me know if I can be of further assistance with her or other patients in the future.  Jabria Loos A. Vear, MD, [facility] 12/14/2023, 11:16 AM Certified in Neurology, Clinical Neurophysiology, Sleep Medicine and Neuroimaging  [facility] [address] [place], [place] [address] [phone]

## 2023-12-14 ENCOUNTER — Ambulatory Visit (INDEPENDENT_AMBULATORY_CARE_PROVIDER_SITE_OTHER): Admitting: Neurology

## 2023-12-14 ENCOUNTER — Encounter: Payer: Self-pay | Admitting: Neurology

## 2023-12-14 ENCOUNTER — Other Ambulatory Visit (HOSPITAL_COMMUNITY): Payer: Self-pay

## 2023-12-14 ENCOUNTER — Telehealth: Payer: Self-pay | Admitting: *Deleted

## 2023-12-14 VITALS — BP 96/62 | HR 81 | Ht 65.0 in | Wt 135.0 lb

## 2023-12-14 DIAGNOSIS — R9082 White matter disease, unspecified: Secondary | ICD-10-CM | POA: Diagnosis not present

## 2023-12-14 DIAGNOSIS — R4789 Other speech disturbances: Secondary | ICD-10-CM

## 2023-12-14 DIAGNOSIS — G43709 Chronic migraine without aura, not intractable, without status migrainosus: Secondary | ICD-10-CM

## 2023-12-14 DIAGNOSIS — R350 Frequency of micturition: Secondary | ICD-10-CM | POA: Diagnosis not present

## 2023-12-14 LAB — AEROBIC/ANAEROBIC CULTURE W GRAM STAIN (SURGICAL/DEEP WOUND): Gram Stain: NONE SEEN

## 2023-12-14 MED ORDER — NURTEC 75 MG PO TBDP: ORAL_TABLET | ORAL | 11 refills | Status: DC

## 2023-12-14 NOTE — Telephone Encounter (Signed)
 Pharmacy Patient Advocate Encounter   Received notification from Physician's Office that prior authorization for Nurtec is required/requested.   Insurance verification completed.   The patient is insured through [facility] .   Per test claim: PA required; PA submitted to above mentioned insurance via Latent Key/confirmation #/EOC [id number] Status is pending

## 2023-12-14 NOTE — Telephone Encounter (Signed)
 Received fax from [facility] that PA Nurtec needed.  Covermymeds key: [id number]

## 2023-12-15 MED ORDER — AIMOVIG 140 MG/ML ~~LOC~~ SOAJ: 1.0000 mL | SUBCUTANEOUS | 11 refills | Status: DC

## 2023-12-15 NOTE — Addendum Note (Signed)
 Addended by: JOSHUA MAURILIO CROME on: 12/15/2023 05:15 PM   Modules accepted: Orders

## 2023-12-15 NOTE — Telephone Encounter (Signed)
 Pharmacy Patient Advocate Encounter  Received notification from [facility] that Prior Authorization for Nurtec has been DENIED.  Full denial letter will be uploaded to the media tab. See denial reason below.   PA #/Case ID/Reference #: [id number]

## 2023-12-15 NOTE — Telephone Encounter (Signed)
 Dr. Vear- any reason she cannot try Aimovig  or Ajovy? I see where she tried [employer] and had flu like sx

## 2023-12-15 NOTE — Telephone Encounter (Signed)
 Monica- have you all received this denial?

## 2023-12-15 NOTE — Telephone Encounter (Signed)
 Alto from [place] called wanting to inform the provider that the Nurtec has been denied due to it not being in the formulary list.

## 2023-12-16 ENCOUNTER — Other Ambulatory Visit: Payer: Self-pay | Admitting: Obstetrics and Gynecology

## 2023-12-16 DIAGNOSIS — Z1231 Encounter for screening mammogram for malignant neoplasm of breast: Secondary | ICD-10-CM

## 2023-12-17 ENCOUNTER — Telehealth: Payer: Self-pay

## 2023-12-17 ENCOUNTER — Other Ambulatory Visit (HOSPITAL_COMMUNITY): Payer: Self-pay

## 2023-12-17 NOTE — Telephone Encounter (Signed)
 Pharmacy Patient Advocate Encounter   Received notification from Physician's Office that prior authorization for Aimovig  140mg /ml auto-injector is required/requested.   Insurance verification completed.   The patient is insured through [facility] .   Per test claim: PA required; PA submitted to above mentioned insurance via Latent Key/confirmation #/EOC [id number] Status is pending

## 2023-12-18 ENCOUNTER — Other Ambulatory Visit (HOSPITAL_COMMUNITY): Payer: Self-pay

## 2023-12-18 NOTE — Telephone Encounter (Signed)
 Pharmacy Patient Advocate Encounter  Received notification from [facility] that Prior Authorization for Aimovig  has been APPROVED from 12/18/2023 to 03/10/2024. Ran test claim, Copay is $0. This test claim was processed through [facility]- copay amounts may vary at other pharmacies due to pharmacy/plan contracts, or as the patient moves through the different stages of their insurance plan.   PA #/Case ID/Reference #: [id number]

## 2023-12-22 ENCOUNTER — Other Ambulatory Visit: Payer: Self-pay | Admitting: Interventional Radiology

## 2023-12-22 DIAGNOSIS — K7689 Other specified diseases of liver: Secondary | ICD-10-CM

## 2023-12-30 DIAGNOSIS — R5382 Chronic fatigue, unspecified: Secondary | ICD-10-CM | POA: Diagnosis not present

## 2023-12-30 DIAGNOSIS — M791 Myalgia, unspecified site: Secondary | ICD-10-CM | POA: Diagnosis not present

## 2024-01-11 ENCOUNTER — Ambulatory Visit
Admission: RE | Admit: 2024-01-11 | Discharge: 2024-01-11 | Disposition: A | Discharge status: Home / Self Care | Source: Ambulatory Visit | Attending: Interventional Radiology | Admitting: Interventional Radiology

## 2024-01-11 DIAGNOSIS — K7689 Other specified diseases of liver: Secondary | ICD-10-CM

## 2024-01-11 MED ORDER — IOPAMIDOL (ISOVUE-370) INJECTION 76%
75.0000 mL | Freq: Once | INTRAVENOUS | Status: AC | PRN
  Administered 2024-01-11: 75 mL via INTRAVENOUS

## 2024-01-11 NOTE — Progress Notes (Signed)
 This encounter was conducted via the [employer] providing interactive audio and visual communication.  The patient provided verbal consent to conduct a virtual appointment.  The patient was located at their primary residence during this encounter.  Referring Physician(s): Dr. Lynnie Bring   Chief Complaint: The patient is seen in virtual video follow up today s/p hepatic cyst aspiration and sclerotherapy 12/09/23.   History of present illness: Brooke Haas, 53 year old female, has a medical history significant for a right hepatic cyst which was first identified in 2004. She has been evaluated in the ED several times this year for abdominal pain and vomiting related to this hepatic cyst. She was briefly hospitalized in May for the same. IR was consulted by Dr. Bring for aspiration and on 11/04/23 I was able to aspirate 10 ml of very viscous fluid from the cyst. Pathology was negative for malignancy.   She was then scheduled for an additional aspiration with sclerotherapy which was performed 12/09/23 under moderate sedation. I was able to remove 80 ml of thick, yellow fluid before injection 5 ml ethanol which was allowed to dwell approximately 10 min. She tolerated the procedure well and was discharged home the same day. Cultures were positive for staph aureus.   Our team followed up with her via telephone call the next day and she reported no pain/discomfort at rest and only some mild discomfort with activity. She presents to the clinic today via virtual video visit for follow up. A CT abdomen was obtained 01/11/24. She is feeling well.  Her prior nausea and discomfort have resolved.  No fevers or chills.  Past Medical History:  Diagnosis Date   Allergy    seasonal   Anemia    years ago prior to hysterectomy   Asthma    Breast mass, right    Breast mass, right    COVID-19 virus infection 06/16/2021   GERD (gastroesophageal reflux disease)    Headache    Hyperlipidemia      Past Surgical History:  Procedure Laterality Date   ABDOMINAL HYSTERECTOMY     BREAST BIOPSY Left    BREAST EXCISIONAL BIOPSY Right    CESAREAN SECTION     x 3   CHOLECYSTECTOMY     COLONOSCOPY     ESOPHAGOGASTRODUODENOSCOPY  12/11/2004   Mild gastritis. Otherwise normal EGD   IR SCLEROTHERAPY OF A FLUID COLLECTION  12/09/2023   RADIOACTIVE SEED GUIDED EXCISIONAL BREAST BIOPSY Right 12/01/2016   Procedure: RIGHT RADIOACTIVE SEED GUIDED EXCISIONAL BREAST BIOPSY;  Surgeon: Ebbie Cough, MD;  Location: [facility];  Service: General;  Laterality: Right;   ROTATOR CUFF REPAIR Right    SINOSCOPY     TONSILLECTOMY     UPPER GASTROINTESTINAL ENDOSCOPY      Allergies: Aleve [naproxen sodium]  Medications: Prior to Admission medications   Medication Sig Start Date End Date Taking? Authorizing Provider  BREZTRI  AEROSPHERE 160-9-4.8 MCG/ACT AERO Inhale 2 puffs into the lungs in the morning and at bedtime. 12/31/21   [provider]  Cholecalciferol 100 MCG (4000 UT) CAPS Take 4,000 Units by mouth daily at 12 noon.    [provider]  Coenzyme Q10 (COQ10) 200 MG CAPS Take 1 capsule by mouth daily at 12 noon.    [provider]  Erenumab -aooe (AIMOVIG ) 140 MG/ML SOAJ Inject 140 mg into the skin every 30 (thirty) days. 12/15/23   Sater, Charlie LABOR, MD  estradiol (ESTRACE) 0.1 MG/GM vaginal cream Place 1 Applicatorful vaginally as  needed (dryness). 01/04/20   [provider]  famotidine  (PEPCID ) 40 MG tablet TAKE 1 TABLET BY MOUTH EVERY EVENING Patient taking differently: Take 40 mg by mouth at bedtime. 05/22/21   Kozlow, Eric J, MD  Fluticasone Propionate (FLONASE NA) Place 1 spray into the nose 2 (two) times daily as needed (allergies).    [provider]  levalbuterol  (XOPENEX  HFA) 45 MCG/ACT inhaler Inhale 2 puffs into the lungs every 6 (six) hours as needed for wheezing or shortness of breath. 12/05/22 12/14/23  Neda Jennet LABOR,  MD  montelukast  (SINGULAIR ) 10 MG tablet Take 1 tablet (10 mg total) by mouth at bedtime. 08/24/23   Neda Jennet LABOR, MD  Multiple Vitamin (MULTIVITAMIN) capsule Take 1 capsule by mouth daily.    [provider]  naratriptan (AMERGE) 2.5 MG tablet Take 2.5 mg by mouth as needed for migraine. Take one (1) tablet at onset of headache; if returns or does not resolve, may repeat after 4 hours; do not exceed five (5) mg in 24 hours.    [provider]  ondansetron  (ZOFRAN -ODT) 4 MG disintegrating tablet Take 2 mg by mouth every 6 (six) hours as needed for nausea, vomiting or refractory nausea / vomiting. 07/21/23   [provider]  pantoprazole  (PROTONIX ) 40 MG tablet Take 40 mg by mouth daily. 12/23/21   [provider]  rosuvastatin (CRESTOR) 5 MG tablet Take 5 mg by mouth daily.    [provider]  topiramate  (TOPAMAX ) 50 MG tablet Take 100 mg by mouth at bedtime. 10/17/21   [provider]  Vitamin D, Ergocalciferol, (DRISDOL) 1.25 MG (50000 UNIT) CAPS capsule Take 50,000 Units by mouth every 7 (seven) days.    [provider]     Family History  Problem Relation Age of Onset   Allergic rhinitis Mother    Breast cancer Mother 34   Allergic rhinitis Father    Allergic rhinitis Sister    Asthma Sister    Breast cancer Paternal Grandmother        26s   Colon cancer Neg Hx    Pancreatic cancer Neg Hx    Liver cancer Neg Hx    Esophageal cancer Neg Hx    Rectal cancer Neg Hx    Colon polyps Neg Hx    Stomach cancer Neg Hx     Social History   Socioeconomic History   Marital status: Married    Spouse name: Not on file   Number of children: Not on file   Years of education: Not on file   Highest education level: Not on file  Occupational History   Occupation: pharmacist    Comment: [employer]  Tobacco Use   Smoking status: Never   Smokeless tobacco: Never  Vaping Use   Vaping status: Never Used  Substance and  Sexual Activity   Alcohol  use: Yes    Comment: social   Drug use: No   Sexual activity: Not on file  Other Topics Concern   Not on file  Social History Narrative   Right handed    Drinks 1 cup per day    Patient is a Teacher, early years/pre    Social Drivers of Corporate investment banker Strain: Not on file  Food Insecurity: No Food Insecurity (09/08/2023)   Hunger Vital Sign    Worried About Running Out of Food in the Last Year: Never true    Ran Out of Food in the Last Year: Never true  Transportation  Needs: No Transportation Needs (09/08/2023)   PRAPARE - Administrator, Civil Service (Medical): No    Lack of Transportation (Non-Medical): No  Physical Activity: Not on file  Stress: Not on file  Social Connections: Not on file     Vital Signs: There were no vitals taken for this visit.  Physical Exam  Patient is alert, oriented and able to participate fully in the conversation. No apparent discomfort or distress observed. She appears appropriately dressed.   Imaging:  CT abdomen/pelvis 09/08/23    Hepatic cyst measuring 6.5 x 5.5 cm   CT abdomen 01/11/24   Labs:  CBC: Recent Labs    09/09/23 0545 09/10/23 0505 11/02/23 0935 12/09/23 1020  WBC 5.7 4.5 4.2 6.6  HGB 11.0* 10.8* 13.4 13.9  HCT 34.5* 32.5* 39.8 41.8  PLT 200 204 251.0 281    COAGS: Recent Labs    11/02/23 0935 12/09/23 1020  INR 1.1* 0.9    BMP: Recent Labs    07/30/23 1053 09/08/23 0620 09/09/23 0545 09/10/23 0505 11/02/23 0935  NA 141 135 140 138 141  K 4.0 3.7 3.2* 3.4* 4.1  CL 109 106 114* 112* 110  CO2 22 19* 19* 23 24  GLUCOSE 98 86 97 95 90  BUN 22* 16 6 <5* 12  CALCIUM 9.3 9.1 7.8* 8.1* 9.1  CREATININE 0.58 0.87 0.52 0.47 0.66  GFRNONAA >60 >60 >60 >60  --     LIVER FUNCTION TESTS: Recent Labs    07/13/23 1104 07/30/23 1053 09/08/23 0620 11/02/23 0935  BILITOT 0.5 0.7 1.0 0.4  AST 274* 38 53* 54*  ALT 115* 50* 60* 53*  ALKPHOS 79 92 124 78  PROT 7.1  7.6 7.2 6.5  ALBUMIN 4.6 4.3 4.2 4.4    Assessment and Plan: 53 year old female with a long-standing history of a right hepatic cyst. She underwent aspiration with ethanol sclerotherapy 12/09/23. Culture demonstrated staph aureus, indeterminate if this is cutaneous contaminate or true infection, though she has remained asymptomatic.  Follow up imaging demonstrates significant decrease in size, and she has has concomitant resolution of symptoms.  Follow up with IR as needed.  Ester Sides, MD Pager: [phone]    I spent a total of 25 Minutes in virtual video clinical consultation, greater than 50% of which was counseling/coordinating care for hepatic cyst.

## 2024-01-12 DIAGNOSIS — L570 Actinic keratosis: Secondary | ICD-10-CM | POA: Diagnosis not present

## 2024-01-13 ENCOUNTER — Ambulatory Visit
Admission: RE | Admit: 2024-01-13 | Discharge: 2024-01-13 | Disposition: A | Discharge status: Home / Self Care | Source: Ambulatory Visit | Attending: Student | Admitting: Student

## 2024-01-13 DIAGNOSIS — K7689 Other specified diseases of liver: Secondary | ICD-10-CM | POA: Diagnosis not present

## 2024-01-13 HISTORY — PX: IR RADIOLOGIST EVAL & MGMT: IMG5224

## 2024-02-01 ENCOUNTER — Ambulatory Visit

## 2024-02-10 ENCOUNTER — Telehealth: Payer: Self-pay

## 2024-02-10 MED ORDER — MONTELUKAST SODIUM 10 MG PO TABS: 10.0000 mg | ORAL_TABLET | Freq: Every day | ORAL | 3 refills | Status: AC

## 2024-02-10 NOTE — Telephone Encounter (Signed)
 Received refill via fax,refill sent.

## 2024-02-11 ENCOUNTER — Ambulatory Visit

## 2024-02-16 DIAGNOSIS — Z01419 Encounter for gynecological examination (general) (routine) without abnormal findings: Secondary | ICD-10-CM | POA: Diagnosis not present

## 2024-02-16 DIAGNOSIS — Z1231 Encounter for screening mammogram for malignant neoplasm of breast: Secondary | ICD-10-CM | POA: Diagnosis not present

## 2024-02-16 DIAGNOSIS — Z13820 Encounter for screening for osteoporosis: Secondary | ICD-10-CM | POA: Diagnosis not present

## 2024-02-19 DIAGNOSIS — M79642 Pain in left hand: Secondary | ICD-10-CM | POA: Diagnosis not present

## 2024-02-19 DIAGNOSIS — M2560 Stiffness of unspecified joint, not elsewhere classified: Secondary | ICD-10-CM | POA: Diagnosis not present

## 2024-02-19 DIAGNOSIS — M2540 Effusion, unspecified joint: Secondary | ICD-10-CM | POA: Diagnosis not present

## 2024-02-19 DIAGNOSIS — M79641 Pain in right hand: Secondary | ICD-10-CM | POA: Diagnosis not present

## 2024-02-24 ENCOUNTER — Ambulatory Visit
Admission: RE | Admit: 2024-02-24 | Discharge: 2024-02-24 | Disposition: A | Discharge status: Home / Self Care | Source: Ambulatory Visit | Attending: Obstetrics and Gynecology | Admitting: Obstetrics and Gynecology

## 2024-02-24 DIAGNOSIS — Z1231 Encounter for screening mammogram for malignant neoplasm of breast: Secondary | ICD-10-CM | POA: Diagnosis not present

## 2024-03-07 DIAGNOSIS — M2540 Effusion, unspecified joint: Secondary | ICD-10-CM | POA: Diagnosis not present

## 2024-03-07 DIAGNOSIS — M79641 Pain in right hand: Secondary | ICD-10-CM | POA: Diagnosis not present

## 2024-03-07 DIAGNOSIS — M2560 Stiffness of unspecified joint, not elsewhere classified: Secondary | ICD-10-CM | POA: Diagnosis not present

## 2024-03-07 DIAGNOSIS — M79642 Pain in left hand: Secondary | ICD-10-CM | POA: Diagnosis not present

## 2024-03-10 DIAGNOSIS — M25642 Stiffness of left hand, not elsewhere classified: Secondary | ICD-10-CM | POA: Diagnosis not present

## 2024-03-10 DIAGNOSIS — M79642 Pain in left hand: Secondary | ICD-10-CM | POA: Diagnosis not present

## 2024-03-10 DIAGNOSIS — M62542 Muscle wasting and atrophy, not elsewhere classified, left hand: Secondary | ICD-10-CM | POA: Diagnosis not present

## 2024-03-10 DIAGNOSIS — M19242 Secondary osteoarthritis, left hand: Secondary | ICD-10-CM | POA: Diagnosis not present

## 2024-03-17 ENCOUNTER — Telehealth: Payer: Self-pay | Admitting: Neurology

## 2024-03-17 NOTE — Telephone Encounter (Signed)
 Last seen 12/14/23 by Dr. Vear:   ASSESSMENT AND PLAN  Chronic migraine w/o aura, not intractable, w/o stat migr  White matter abnormality on MRI of brain  Word finding difficulty   In summary, Ms. Diloreto is a 53 year old woman who has chronic migraine headaches, fatigue and mild cognitive issues since COVID-19 who was found to have a couple punctate T2/FLAIR hyperintense foci in the deep white matter of the cerebral hemispheres.  I personally reviewed the MRIs.  I do not feel that this represents demyelination.  It is most likely very minimal chronic microvascular ischemic change or sequela of migraine headaches. No further evaluation is necessary as the MRI is normal for age.  I do not think she needs a follow-up MRI She continues to experience migraine headaches.  Topamax  may be causing or contributing to her cognitive fog.  I will switch her to Nurtec 75 mg p.o. every other day.  In the past, she has been on Topamax  for prophylaxis that affect her cognition.  She has also been on Emgality that was poorly tolerated due to flulike symptoms. Stay active and exercise as tolerated. She will follow-up with us  in 6 or 7 months for the migraines.  If she is unable to tolerate Nurtec, consider zonisamide levetiracetam or nortriptyline for migraine prophylaxis.   Thank you for asking me to see Ms. Frymire.  Please me know if I can be of further assistance with her or other patients in the future.   Richard A. Vear, MD, [facility] 12/14/2023, 11:16 AM Certified in Neurology, Clinical Neurophysiology, Sleep Medicine and Neuroimaging   [facility] [address] [place], [place] [address] [phone]      I called pt and scheduled appt for 03/21/24 at 930am with Dr. Vear to discuss other medications options d/t ongoing migraines.

## 2024-03-17 NOTE — Telephone Encounter (Signed)
 Pt called stating that she continues to have 3-4 migraines a week and she would like to know if there is anything else she can take along with her other medication. Please advise.

## 2024-03-18 ENCOUNTER — Ambulatory Visit (INDEPENDENT_AMBULATORY_CARE_PROVIDER_SITE_OTHER)
Admission: RE | Admit: 2024-03-18 | Discharge: 2024-03-18 | Disposition: A | Discharge status: Home / Self Care | Source: Ambulatory Visit | Attending: Obstetrics and Gynecology | Admitting: Obstetrics and Gynecology

## 2024-03-18 ENCOUNTER — Other Ambulatory Visit (HOSPITAL_BASED_OUTPATIENT_CLINIC_OR_DEPARTMENT_OTHER): Payer: Self-pay | Admitting: Obstetrics and Gynecology

## 2024-03-18 DIAGNOSIS — M25642 Stiffness of left hand, not elsewhere classified: Secondary | ICD-10-CM | POA: Diagnosis not present

## 2024-03-18 DIAGNOSIS — Z13820 Encounter for screening for osteoporosis: Secondary | ICD-10-CM | POA: Diagnosis not present

## 2024-03-18 DIAGNOSIS — M79642 Pain in left hand: Secondary | ICD-10-CM | POA: Diagnosis not present

## 2024-03-18 DIAGNOSIS — Z1231 Encounter for screening mammogram for malignant neoplasm of breast: Secondary | ICD-10-CM

## 2024-03-18 DIAGNOSIS — M62542 Muscle wasting and atrophy, not elsewhere classified, left hand: Secondary | ICD-10-CM | POA: Diagnosis not present

## 2024-03-18 DIAGNOSIS — Z780 Asymptomatic menopausal state: Secondary | ICD-10-CM | POA: Diagnosis not present

## 2024-03-18 DIAGNOSIS — M8589 Other specified disorders of bone density and structure, multiple sites: Secondary | ICD-10-CM | POA: Diagnosis not present

## 2024-03-18 DIAGNOSIS — M19242 Secondary osteoarthritis, left hand: Secondary | ICD-10-CM | POA: Diagnosis not present

## 2024-03-21 ENCOUNTER — Encounter: Payer: Self-pay | Admitting: Neurology

## 2024-03-21 ENCOUNTER — Ambulatory Visit: Admitting: Neurology

## 2024-03-21 VITALS — BP 117/78 | HR 71 | Ht 64.0 in | Wt 140.5 lb

## 2024-03-21 DIAGNOSIS — R9082 White matter disease, unspecified: Secondary | ICD-10-CM

## 2024-03-21 DIAGNOSIS — R4789 Other speech disturbances: Secondary | ICD-10-CM | POA: Diagnosis not present

## 2024-03-21 DIAGNOSIS — G43709 Chronic migraine without aura, not intractable, without status migrainosus: Secondary | ICD-10-CM | POA: Diagnosis not present

## 2024-03-21 MED ORDER — ZONISAMIDE 100 MG PO CAPS: ORAL_CAPSULE | ORAL | 11 refills | Status: AC

## 2024-03-21 NOTE — Progress Notes (Signed)
 [facility]  PATIENT: Brooke Haas DOB: 09-10-1970  REFERRING DOCTOR OR PCP: Harlene Leff, NP SOURCE: Patient, notes from primary care, imaging and lab reports, MRI images personally reviewed.  _________________________________   HISTORICAL  CHIEF COMPLAINT:  Chief Complaint  Patient presents with   Migraine    RM11, alone, PT PRESENTS HERE TODAY IN REGARDS TO Ongoing migraines- & TO Discuss other treatment options. Pt is a pharmd herself    HISTORY OF PRESENT ILLNESS:   Brooke Haas, is a 53 y.o. woman with headaches and mild white matter changes on MRI.  UPDATE 11/24/205 She has headaches and fatigue.   The pain is behind the eye mostly.  At the last visit Aimovig  was tried but it was poorly tolerated.  She reports brain fog with reduced focus and noting difficulty with word recall.      The cognitive issues fluctuate.  Migraines are back up to 15-18/month.   With a migraine she gets throbbing pain, Nausea but no vomiting (hlped by Zofran ), photophobia and phonophobia.  Moving worsens the pain and laying down in a dark room and sleep help.    On Emgality ad Aimovig  she had fatigue and myalgia.  On topiramate  she has some benefit but it causes brain fog so has not increased further from 100 mg nightly.    She had COVID-19 in December 2020.  She had loss of smell and taste.  She had a pneumonia requiring antibiotics.  Due to continued SOB, she was on prednisone  and inhalers and saw pulmonology.    Her respiratory issues have done better lately. .    She sleeps well many nights.  She feels mood is doing ok.       MRI from 08/28/2023 shows  2 or 3 punctate T2/FLAIR hyperintense foci in the deep white matter.  This is most consistent with very minimal chronic microvascular ischemic change or sequela of migraine headache.  Demyelination is less likely to have this pattern.  Chronic meds tried: Topiramate :  Brain fog, word finding difficulty Emgality and  Aimovig :  Poorly tolerated due to flu like symptoms and fatigue.  Acute Meds; Imitrex:  Less effective Naratriptan  Some benefit.      REVIEW OF SYSTEMS: Constitutional: No fevers, chills, sweats, or change in appetite Eyes: No visual changes, double vision, eye pain Ear, nose and throat: No hearing loss, ear pain, nasal congestion, sore throat Cardiovascular: No chest pain, palpitations Respiratory:  No shortness of breath at rest or with exertion.   No wheezes GastrointestinaI: No nausea, vomiting, diarrhea, abdominal pain, fecal incontinence Genitourinary:  No dysuria, urinary retention or frequency.  No nocturia. Musculoskeletal:  No neck pain, back pain Integumentary: No rash, pruritus, skin lesions Neurological: as above Psychiatric: No depression at this time.  No anxiety Endocrine: No palpitations, diaphoresis, change in appetite, change in weigh or increased thirst Hematologic/Lymphatic:  No anemia, purpura, petechiae. Allergic/Immunologic: No itchy/runny eyes, nasal congestion, recent allergic reactions, rashes  ALLERGIES: Allergies  Allergen Reactions   Aleve [Naproxen Sodium] Anaphylaxis   Naproxen Anaphylaxis    HOME MEDICATIONS:  Current Outpatient Medications:    BREZTRI  AEROSPHERE 160-9-4.8 MCG/ACT AERO, Inhale 2 puffs into the lungs in the morning and at bedtime., Disp: , Rfl:    Cholecalciferol 100 MCG (4000 UT) CAPS, Take 4,000 Units by mouth daily at 12 noon., Disp: , Rfl:    Coenzyme Q10 (COQ10) 200 MG CAPS, Take 1 capsule by mouth daily at 12 noon., Disp: , Rfl:  estradiol (ESTRACE) 0.1 MG/GM vaginal cream, Place 1 Applicatorful vaginally as needed (dryness)., Disp: , Rfl:    famotidine  (PEPCID ) 40 MG tablet, TAKE 1 TABLET BY MOUTH EVERY EVENING (Patient taking differently: Take 40 mg by mouth at bedtime.), Disp: 30 tablet, Rfl: 1   Fluticasone Propionate (FLONASE NA), Place 1 spray into the nose 2 (two) times daily as needed (allergies)., Disp: , Rfl:     levalbuterol  (XOPENEX  HFA) 45 MCG/ACT inhaler, Inhale 2 puffs into the lungs every 6 (six) hours as needed for wheezing or shortness of breath., Disp: 1 each, Rfl: 5   montelukast  (SINGULAIR ) 10 MG tablet, Take 1 tablet (10 mg total) by mouth at bedtime., Disp: 30 tablet, Rfl: 3   Multiple Vitamin (MULTIVITAMIN) capsule, Take 1 capsule by mouth daily., Disp: , Rfl:    naratriptan (AMERGE) 2.5 MG tablet, Take 2.5 mg by mouth as needed for migraine. Take one (1) tablet at onset of headache; if returns or does not resolve, may repeat after 4 hours; do not exceed five (5) mg in 24 hours., Disp: , Rfl:    ondansetron  (ZOFRAN -ODT) 4 MG disintegrating tablet, Take 2 mg by mouth every 6 (six) hours as needed for nausea, vomiting or refractory nausea / vomiting., Disp: , Rfl:    pantoprazole  (PROTONIX ) 40 MG tablet, Take 40 mg by mouth daily., Disp: , Rfl:    rosuvastatin (CRESTOR) 5 MG tablet, Take 5 mg by mouth daily., Disp: , Rfl:    topiramate  (TOPAMAX ) 50 MG tablet, Take 100 mg by mouth at bedtime., Disp: , Rfl:   PAST MEDICAL HISTORY: Past Medical History:  Diagnosis Date   Allergy    seasonal   Anemia    years ago prior to hysterectomy   Asthma    Breast mass, right    Breast mass, right    COVID-19 virus infection 06/16/2021   GERD (gastroesophageal reflux disease)    Headache    Hyperlipidemia     PAST SURGICAL HISTORY: Past Surgical History:  Procedure Laterality Date   ABDOMINAL HYSTERECTOMY     BREAST BIOPSY Left    BREAST EXCISIONAL BIOPSY Right    CESAREAN SECTION     x 3   CHOLECYSTECTOMY     COLONOSCOPY     ESOPHAGOGASTRODUODENOSCOPY  12/11/2004   Mild gastritis. Otherwise normal EGD   IR RADIOLOGIST EVAL & MGMT  01/13/2024   IR SCLEROTHERAPY OF A FLUID COLLECTION  12/09/2023   RADIOACTIVE SEED GUIDED EXCISIONAL BREAST BIOPSY Right 12/01/2016   Procedure: RIGHT RADIOACTIVE SEED GUIDED EXCISIONAL BREAST BIOPSY;  Surgeon: Ebbie Cough, MD;  Location: [facility];  Service: General;  Laterality: Right;   ROTATOR CUFF REPAIR Right    SINOSCOPY     TONSILLECTOMY     UPPER GASTROINTESTINAL ENDOSCOPY      FAMILY HISTORY: Family History  Problem Relation Age of Onset   Allergic rhinitis Mother    Breast cancer Mother 25   Allergic rhinitis Father    Allergic rhinitis Sister    Asthma Sister    Breast cancer Paternal Grandmother        44s   Colon cancer Neg Hx    Pancreatic cancer Neg Hx    Liver cancer Neg Hx    Esophageal cancer Neg Hx    Rectal cancer Neg Hx    Colon polyps Neg Hx    Stomach cancer Neg Hx     SOCIAL HISTORY: Social History   Socioeconomic History   Marital status:  Married    Spouse name: Not on file   Number of children: Not on file   Years of education: Not on file   Highest education level: Not on file  Occupational History   Occupation: pharmacist    Comment: [employer]  Tobacco Use   Smoking status: Never   Smokeless tobacco: Never  Vaping Use   Vaping status: Never Used  Substance and Sexual Activity   Alcohol  use: Yes    Comment: social   Drug use: No   Sexual activity: Not on file  Other Topics Concern   Not on file  Social History Narrative   Right handed    Drinks 1 cup per day    Patient is a teacher, early years/pre    Social Drivers of Corporate Investment Banker Strain: Not on file  Food Insecurity: No Food Insecurity (09/08/2023)   Hunger Vital Sign    Worried About Running Out of Food in the Last Year: Never true    Ran Out of Food in the Last Year: Never true  Transportation Needs: No Transportation Needs (09/08/2023)   PRAPARE - Administrator, Civil Service (Medical): No    Lack of Transportation (Non-Medical): No  Physical Activity: Not on file  Stress: Not on file  Social Connections: Not on file  Intimate Partner Violence: Not At Risk (09/08/2023)   Humiliation, Afraid, Rape, and Kick questionnaire    Fear of Current or Ex-Partner: No    Emotionally  Abused: No    Physically Abused: No    Sexually Abused: No       PHYSICAL EXAM  Vitals:   03/21/24 0927  BP: 117/78  Pulse: 71  Weight: 140 lb 8 oz (63.7 kg)  Height: 5' 4 (1.626 m)    Body mass index is 24.12 kg/m.   General: The patient is well-developed and well-nourished and in no acute distress  HEENT:  Head is [place]/AT.  Sclera are anicteric.    Neck:   The neck is nontender.  Over the occiput paraspinal muscles   Skin: Extremities are without rash or  edema.    Neurologic Exam  Mental status: The patient is alert and oriented x 3 at the time of the examination. The patient has apparent normal recent and remote memory, with an apparently normal attention span and concentration ability.   Speech is normal.  Cranial nerves: Extraocular movements are full.   There is good facial sensation to soft touch bilaterally.Facial strength is normal.  Trapezius and sternocleidomastoid strength is normal. No dysarthria is noted.   No obvious hearing deficits are noted.  Motor:  Muscle bulk is normal.   Tone is normal. Strength is  5 / 5 in all 4 extremities.   Sensory: Sensory testing is intact to vibration sensation in all 4 extremities.  Coordination: Cerebellar testing reveals good finger-nose-finger and heel-to-shin bilaterally.  Gait and station: Station is normal.   Gait is normal. Tandem gait is normal.  No Romberg sign.  Reflexes: Deep tendon reflexes are symmetric and normal bilaterally.        DIAGNOSTIC DATA (LABS, IMAGING, TESTING) - I reviewed patient records, labs, notes, testing and imaging myself where available.  Lab Results  Component Value Date   WBC 6.6 12/09/2023   HGB 13.9 12/09/2023   HCT 41.8 12/09/2023   MCV 93.1 12/09/2023   PLT 281 12/09/2023      Component Value Date/Time   NA 141 11/02/2023 0935   K  4.1 11/02/2023 0935   CL 110 11/02/2023 0935   CO2 24 11/02/2023 0935   GLUCOSE 90 11/02/2023 0935   BUN 12 11/02/2023 0935    CREATININE 0.66 11/02/2023 0935   CALCIUM 9.1 11/02/2023 0935   PROT 6.5 11/02/2023 0935   ALBUMIN 4.4 11/02/2023 0935   AST 54 (H) 11/02/2023 0935   ALT 53 (H) 11/02/2023 0935   ALKPHOS 78 11/02/2023 0935   BILITOT 0.4 11/02/2023 0935   GFRNONAA >60 09/10/2023 0505    Lab Results  Component Value Date   TSH 1.060 07/13/2023       ASSESSMENT AND PLAN  Chronic migraine w/o aura, not intractable, w/o stat migr  White matter abnormality on MRI of brain  Word finding difficulty    Change topiramate  to zonisamide  100-200 at night.   If no benefit, consider levetiracetam or nortriptyline for migraine prophylaxis. Stay active and exercise as tolerated. Nurtec 75 mg #4   Lot [id number]   10/2026 MRI foci not typical for MS - more c/w mild chronic microvascular disease or sequelae of migraine. She will follow-up with us  in 6 or 7 months for the migraines.   Kirsti Mcalpine A. Vear, MD, [facility] 03/21/2024, 10:00 AM Certified in Neurology, Clinical Neurophysiology, Sleep Medicine and Neuroimaging  [facility] [address] [place], [place] [address] [phone]

## 2024-03-22 ENCOUNTER — Encounter: Payer: Self-pay | Admitting: Neurology

## 2024-03-22 ENCOUNTER — Other Ambulatory Visit: Payer: Self-pay | Admitting: Neurology

## 2024-03-22 DIAGNOSIS — M19242 Secondary osteoarthritis, left hand: Secondary | ICD-10-CM | POA: Diagnosis not present

## 2024-03-22 DIAGNOSIS — M62542 Muscle wasting and atrophy, not elsewhere classified, left hand: Secondary | ICD-10-CM | POA: Diagnosis not present

## 2024-03-22 DIAGNOSIS — M25642 Stiffness of left hand, not elsewhere classified: Secondary | ICD-10-CM | POA: Diagnosis not present

## 2024-03-22 DIAGNOSIS — M79642 Pain in left hand: Secondary | ICD-10-CM | POA: Diagnosis not present

## 2024-03-22 MED ORDER — NURTEC 75 MG PO TBDP: ORAL_TABLET | ORAL | 11 refills | Status: DC

## 2024-03-23 ENCOUNTER — Other Ambulatory Visit (HOSPITAL_COMMUNITY): Payer: Self-pay

## 2024-03-23 ENCOUNTER — Telehealth: Payer: Self-pay | Admitting: Pharmacist

## 2024-03-23 NOTE — Telephone Encounter (Signed)
 Pharmacy Patient Advocate Encounter   Received notification from Patient Pharmacy that prior authorization for Nurtec 75MG  dispersible tablets is required/requested.   Insurance verification completed.   The patient is insured through [facility].   Per test claim: PA required; PA submitted to above mentioned insurance via Faxed Nurtec form directly to [facility] [phone] Key/confirmation #/EOC [id number] Status is pending

## 2024-03-26 ENCOUNTER — Encounter: Payer: Self-pay | Admitting: Gastroenterology

## 2024-03-28 ENCOUNTER — Encounter (HOSPITAL_BASED_OUTPATIENT_CLINIC_OR_DEPARTMENT_OTHER): Payer: Self-pay

## 2024-03-28 ENCOUNTER — Ambulatory Visit (HOSPITAL_BASED_OUTPATIENT_CLINIC_OR_DEPARTMENT_OTHER)
Admission: EM | Admit: 2024-03-28 | Discharge: 2024-03-28 | Disposition: A | Discharge status: Home / Self Care | Attending: Family Medicine | Admitting: Family Medicine

## 2024-03-28 ENCOUNTER — Other Ambulatory Visit: Payer: Self-pay | Admitting: Neurology

## 2024-03-28 DIAGNOSIS — R350 Frequency of micturition: Secondary | ICD-10-CM | POA: Insufficient documentation

## 2024-03-28 LAB — POCT URINE DIPSTICK
Bilirubin, UA: NEGATIVE
Glucose, UA: NEGATIVE mg/dL
Ketones, POC UA: NEGATIVE mg/dL
Nitrite, UA: NEGATIVE
Protein Ur, POC: 30 mg/dL — AB
Spec Grav, UA: 1.01 (ref 1.010–1.025)
Urobilinogen, UA: 0.2 U/dL
pH, UA: 6.5 (ref 5.0–8.0)

## 2024-03-28 MED ORDER — UBRELVY 100 MG PO TABS: ORAL_TABLET | ORAL | 11 refills | Status: DC

## 2024-03-28 MED ORDER — NITROFURANTOIN MONOHYD MACRO 100 MG PO CAPS: 100.0000 mg | ORAL_CAPSULE | Freq: Two times a day (BID) | ORAL | 0 refills | Status: AC

## 2024-03-28 NOTE — Telephone Encounter (Signed)
 Dr. Vear- can you call in [place] instead? Nurtec not covered, she has to try/fail Ubrelvy. Pt aware and ok with plan. Rx below ready to e-scribe

## 2024-03-28 NOTE — ED Provider Notes (Signed)
 [facility]    CSN: [id number] Arrival date & time: 03/28/24  1532      History   Chief Complaint Chief Complaint  Patient presents with   Urinary Frequency    HPI Brooke Haas is a 53 y.o. female.   Pt c/o frequent urination burning blood in urine x 1 day. Pt denies abdominal pain/back pain. Pt has not taken anything for current symptoms.  Overall not feeling well. No flank  pain, fever.     Urinary Frequency    Past Medical History:  Diagnosis Date   Allergy    seasonal   Anemia    years ago prior to hysterectomy   Asthma    Breast mass, right    Breast mass, right    COVID-19 virus infection 06/16/2021   GERD (gastroesophageal reflux disease)    Headache    Hyperlipidemia     Patient Active Problem List   Diagnosis Date Noted   Intractable nausea and vomiting 09/09/2023   Intractable vomiting 09/08/2023   Acute bacterial rhinosinusitis 02/28/2022   Upper airway cough syndrome 02/28/2022   Seasonal allergies 08/23/2020   Asthma 08/23/2020   GERD (gastroesophageal reflux disease) 08/23/2020    Past Surgical History:  Procedure Laterality Date   ABDOMINAL HYSTERECTOMY     BREAST BIOPSY Left    BREAST EXCISIONAL BIOPSY Right    CESAREAN SECTION     x 3   CHOLECYSTECTOMY     COLONOSCOPY     ESOPHAGOGASTRODUODENOSCOPY  12/11/2004   Mild gastritis. Otherwise normal EGD   IR RADIOLOGIST EVAL & MGMT  01/13/2024   IR SCLEROTHERAPY OF A FLUID COLLECTION  12/09/2023   RADIOACTIVE SEED GUIDED EXCISIONAL BREAST BIOPSY Right 12/01/2016   Procedure: RIGHT RADIOACTIVE SEED GUIDED EXCISIONAL BREAST BIOPSY;  Surgeon: Ebbie Cough, MD;  Location: [facility];  Service: General;  Laterality: Right;   ROTATOR CUFF REPAIR Right    SINOSCOPY     TONSILLECTOMY     UPPER GASTROINTESTINAL ENDOSCOPY      OB History   No obstetric history on file.      Home Medications    Prior to Admission medications   Medication Sig Start  Date End Date Taking? Authorizing Provider  BREZTRI  AEROSPHERE 160-9-4.8 MCG/ACT AERO Inhale 2 puffs into the lungs in the morning and at bedtime. 12/31/21  Yes [provider]  Cholecalciferol 100 MCG (4000 UT) CAPS Take 4,000 Units by mouth daily at 12 noon.   Yes [provider]  Coenzyme Q10 (COQ10) 200 MG CAPS Take 1 capsule by mouth daily at 12 noon.   Yes [provider]  estradiol (ESTRACE) 0.1 MG/GM vaginal cream Place 1 Applicatorful vaginally as needed (dryness). 01/04/20  Yes [provider]  famotidine  (PEPCID ) 40 MG tablet TAKE 1 TABLET BY MOUTH EVERY EVENING Patient taking differently: Take 40 mg by mouth at bedtime. 05/22/21  Yes Kozlow, Eric J, MD  Fluticasone Propionate (FLONASE NA) Place 1 spray into the nose 2 (two) times daily as needed (allergies).   Yes [provider]  levalbuterol  (XOPENEX  HFA) 45 MCG/ACT inhaler Inhale 2 puffs into the lungs every 6 (six) hours as needed for wheezing or shortness of breath. 12/05/22 03/21/25 Yes Olalere, Adewale A, MD  montelukast  (SINGULAIR ) 10 MG tablet Take 1 tablet (10 mg total) by mouth at bedtime. 02/10/24  Yes Olalere, Adewale A, MD  Multiple Vitamin (MULTIVITAMIN) capsule Take 1 capsule by mouth daily.   Yes [provider]  naratriptan (AMERGE) 2.5 MG tablet Take 2.5 mg by mouth as needed for migraine. Take one (1) tablet at onset of headache; if returns or does not resolve, may repeat after 4 hours; do not exceed five (5) mg in 24 hours.   Yes [provider]  nitrofurantoin, macrocrystal-monohydrate, (MACROBID) 100 MG capsule Take 1 capsule (100 mg total) by mouth 2 (two) times daily. 03/28/24  Yes Kais Monje A, FNP  ondansetron  (ZOFRAN -ODT) 4 MG disintegrating tablet Take 2 mg by mouth every 6 (six) hours as needed for nausea, vomiting or refractory nausea / vomiting. 07/21/23  Yes [provider]  pantoprazole  (PROTONIX ) 40 MG tablet Take 40 mg by mouth daily. 12/23/21   Yes [provider]  rosuvastatin (CRESTOR) 5 MG tablet Take 5 mg by mouth daily.   Yes [provider]  zonisamide  (ZONEGRAN ) 100 MG capsule one or two po qHS 03/21/24  Yes Sater, Charlie LABOR, MD  topiramate  (TOPAMAX ) 50 MG tablet Take 100 mg by mouth at bedtime. 10/17/21   [provider]  Ubrogepant (UBRELVY) 100 MG TABS One po up to one a day prn migraine 03/28/24   Sater, Charlie LABOR, MD    Family History Family History  Problem Relation Age of Onset   Allergic rhinitis Mother    Breast cancer Mother 31   Allergic rhinitis Father    Allergic rhinitis Sister    Asthma Sister    Breast cancer Paternal Grandmother        52s   Colon cancer Neg Hx    Pancreatic cancer Neg Hx    Liver cancer Neg Hx    Esophageal cancer Neg Hx    Rectal cancer Neg Hx    Colon polyps Neg Hx    Stomach cancer Neg Hx     Social History Social History   Tobacco Use   Smoking status: Never   Smokeless tobacco: Never  Vaping Use   Vaping status: Never Used  Substance Use Topics   Alcohol  use: Yes    Comment: social   Drug use: No     Allergies   Aleve [naproxen sodium] and Naproxen   Review of Systems Review of Systems  Genitourinary:  Positive for frequency.     Physical Exam Triage Vital Signs ED Triage Vitals  Encounter Vitals Group     BP 03/28/24 1648 98/66     Girls Systolic BP Percentile --      Girls Diastolic BP Percentile --      Boys Systolic BP Percentile --      Boys Diastolic BP Percentile --      Pulse Rate 03/28/24 1648 78     Resp 03/28/24 1648 18     Temp 03/28/24 1648 98.1 F (36.7 C)     Temp Source 03/28/24 1648 Oral     SpO2 03/28/24 1648 96 %     Weight --      Height --      Head Circumference --      Peak Flow --      Pain Score 03/28/24 1645 8     Pain Loc --      Pain Education --      Exclude from Growth Chart --    No data found.  Updated Vital Signs BP 98/66 (BP Location: Right Arm)   Pulse 78   Temp 98.1 F  (36.7 C) (Oral)   Resp 18   SpO2 96%   Visual Acuity Right Eye Distance:  Left Eye Distance:   Bilateral Distance:    Right Eye Near:   Left Eye Near:    Bilateral Near:     Physical Exam Vitals and nursing note reviewed.  Constitutional:      General: She is not in acute distress.    Appearance: Normal appearance. She is not ill-appearing, toxic-appearing or diaphoretic.  Pulmonary:     Effort: Pulmonary effort is normal.  Neurological:     Mental Status: She is alert.  Psychiatric:        Mood and Affect: Mood normal.      UC Treatments / Results  Labs (all labs ordered are listed, but only abnormal results are displayed) Labs Reviewed  POCT URINE DIPSTICK - Abnormal; Notable for the following components:      Result Value   Clarity, UA cloudy (*)    Blood, UA large (*)    Protein Ur, POC =30 (*)    Leukocytes, UA Small (1+) (*)    All other components within normal limits  URINE CULTURE    EKG   Radiology No results found.  Procedures Procedures (including critical care time)  Medications Ordered in UC Medications - No data to display  Initial Impression / Assessment and Plan / UC Course  I have reviewed the triage vital signs and the nursing notes.  Pertinent labs & imaging results that were available during my care of the patient were reviewed by me and considered in my medical decision making (see chart for details).     Urinary frequency- Urine positive for infection.  We will treat with antibiotics at this time.  Sending for culture.  Make sure you are pushing fluids.  Follow-up as needed Final Clinical Impressions(s) / UC Diagnoses   Final diagnoses:  Urinary frequency     Discharge Instructions      Urine positive for infection.  We will treat with antibiotics at this time.  Sending for culture.  Make sure you are pushing fluids.  Follow-up as needed     ED Prescriptions     Medication Sig Dispense Auth. Provider    nitrofurantoin, macrocrystal-monohydrate, (MACROBID) 100 MG capsule Take 1 capsule (100 mg total) by mouth 2 (two) times daily. 10 capsule Adah Wilbert LABOR, FNP      PDMP not reviewed this encounter.   Adah Wilbert LABOR, FNP 03/28/24 [phone]

## 2024-03-28 NOTE — ED Notes (Signed)
 Patient reported she wants to try to provide a specimen while waiting in lobby. Provided patient with cup, castile soap wipes and gave instruction on CCMS urine collection. Patient only able to provide maybe one or two drops of urine. Quantity insufficient for testing. Will attempt again once in treatment area. Patient has fluid to drink.

## 2024-03-28 NOTE — ED Triage Notes (Signed)
 Pt c/o frequent urination burning blood in urine x 1 day. Pt denies abdominal pain/back pain. Pt has not taken anything for current symptoms.

## 2024-03-28 NOTE — Addendum Note (Signed)
 Addended by: JOSHUA MAURILIO CROME on: 03/28/2024 08:58 AM   Modules accepted: Orders

## 2024-03-28 NOTE — Telephone Encounter (Signed)
 Pharmacy Patient Advocate Encounter  Received notification from [facility] that Prior Authorization for Nurtec has been DENIED.  Full denial letter will be uploaded to the media tab. See denial reason below.   PA #/Case ID/Reference #: [id number]

## 2024-03-28 NOTE — Discharge Instructions (Addendum)
 Urine positive for infection.  We will treat with antibiotics at this time.  Sending for culture.  Make sure you are pushing fluids.  Follow-up as needed

## 2024-03-29 ENCOUNTER — Other Ambulatory Visit (HOSPITAL_COMMUNITY): Payer: Self-pay

## 2024-03-29 ENCOUNTER — Telehealth: Payer: Self-pay

## 2024-03-29 NOTE — Telephone Encounter (Signed)
 Pharmacy Patient Advocate Encounter   Received notification from Fax that prior authorization for Ubrelvy 100mg  Tablets is required/requested.   Insurance verification completed.   The patient is insured through [facility].   Per test claim: PA required; PA submitted to above mentioned insurance via CoverMyMeds Key/confirmation #/EOC [id number] Status is pending

## 2024-03-30 ENCOUNTER — Ambulatory Visit (HOSPITAL_COMMUNITY): Payer: Self-pay

## 2024-03-30 LAB — URINE CULTURE: Culture: >=100000 — AB

## 2024-03-30 MED ORDER — SULFAMETHOXAZOLE-TRIMETHOPRIM 800-160 MG PO TABS: 1.0000 | ORAL_TABLET | Freq: Two times a day (BID) | ORAL | 0 refills | Status: AC

## 2024-03-31 ENCOUNTER — Other Ambulatory Visit (HOSPITAL_COMMUNITY): Payer: Self-pay

## 2024-03-31 DIAGNOSIS — M79642 Pain in left hand: Secondary | ICD-10-CM | POA: Diagnosis not present

## 2024-03-31 DIAGNOSIS — M25642 Stiffness of left hand, not elsewhere classified: Secondary | ICD-10-CM | POA: Diagnosis not present

## 2024-03-31 DIAGNOSIS — M19242 Secondary osteoarthritis, left hand: Secondary | ICD-10-CM | POA: Diagnosis not present

## 2024-03-31 DIAGNOSIS — M62542 Muscle wasting and atrophy, not elsewhere classified, left hand: Secondary | ICD-10-CM | POA: Diagnosis not present

## 2024-03-31 NOTE — Telephone Encounter (Signed)
 Pharmacy Patient Advocate Encounter  Received notification from [facility] that Prior Authorization for Ubrelvy 100MG  tablets  has been APPROVED from 03/31/2024 to 06/23/2024   PA #/Case ID/Reference #: [id number]

## 2024-04-04 MED ORDER — UBRELVY 100 MG PO TABS: ORAL_TABLET | ORAL | 11 refills | Status: AC

## 2024-04-04 NOTE — Telephone Encounter (Signed)
 Refilled for 16 tabs as requested

## 2024-04-04 NOTE — Addendum Note (Signed)
 Addended by: ONEITA HOIST E on: 04/04/2024 12:52 PM   Modules accepted: Orders

## 2024-04-04 NOTE — Telephone Encounter (Signed)
 Collin from [facility] called to speak to nurse about changing the quanity of Pt medication Ubrogepant  (UBRELVY ) 100 MG TABS  from 10 to 16 packs due to they  only have packs of 16   Callback # [phone]

## 2024-04-05 DIAGNOSIS — M25642 Stiffness of left hand, not elsewhere classified: Secondary | ICD-10-CM | POA: Diagnosis not present

## 2024-04-05 DIAGNOSIS — M79642 Pain in left hand: Secondary | ICD-10-CM | POA: Diagnosis not present

## 2024-04-05 DIAGNOSIS — M62542 Muscle wasting and atrophy, not elsewhere classified, left hand: Secondary | ICD-10-CM | POA: Diagnosis not present

## 2024-04-05 DIAGNOSIS — M19242 Secondary osteoarthritis, left hand: Secondary | ICD-10-CM | POA: Diagnosis not present

## 2024-04-12 DIAGNOSIS — M25562 Pain in left knee: Secondary | ICD-10-CM | POA: Diagnosis not present

## 2024-04-14 DIAGNOSIS — M25562 Pain in left knee: Secondary | ICD-10-CM | POA: Diagnosis not present

## 2024-04-22 ENCOUNTER — Other Ambulatory Visit (HOSPITAL_COMMUNITY): Payer: Self-pay

## 2024-07-14 ENCOUNTER — Ambulatory Visit: Admitting: Neurology
